# Patient Record
Sex: Female | Born: 1975 | Race: White | Hispanic: No | Marital: Married
Health system: Midwestern US, Academic
[De-identification: ages and names within clinical notes are randomized; demographics above are authoritative.]

---

## 2017-03-28 ENCOUNTER — Ambulatory Visit: Admit: 2017-03-28 | Discharge: 2017-03-28

## 2017-03-28 ENCOUNTER — Encounter: Admit: 2017-03-28 | Discharge: 2017-03-28

## 2017-03-28 ENCOUNTER — Ambulatory Visit: Admit: 2017-03-28 | Discharge: 2017-03-28 | Payer: MEDICAID

## 2017-03-28 DIAGNOSIS — G43101 Migraine with aura, not intractable, with status migrainosus: ICD-10-CM

## 2017-03-28 DIAGNOSIS — F419 Anxiety disorder, unspecified: Principal | ICD-10-CM

## 2017-03-28 DIAGNOSIS — M5412 Radiculopathy, cervical region: ICD-10-CM

## 2017-03-28 DIAGNOSIS — M5030 Other cervical disc degeneration, unspecified cervical region: Principal | ICD-10-CM

## 2017-03-28 DIAGNOSIS — I10 Essential (primary) hypertension: ICD-10-CM

## 2017-03-28 DIAGNOSIS — R110 Nausea: ICD-10-CM

## 2017-03-28 MED ORDER — TOPIRAMATE 25 MG PO TAB
25 mg | ORAL_TABLET | Freq: Two times a day (BID) | ORAL | 3 refills | Status: AC
Start: 2017-03-28 — End: 2017-06-14
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
## 2017-04-05 ENCOUNTER — Ambulatory Visit: Admit: 2017-04-05 | Discharge: 2017-04-05 | Payer: MEDICAID

## 2017-04-05 DIAGNOSIS — M5030 Other cervical disc degeneration, unspecified cervical region: Principal | ICD-10-CM

## 2017-04-05 DIAGNOSIS — M5412 Radiculopathy, cervical region: ICD-10-CM

## 2017-04-05 DIAGNOSIS — G43101 Migraine with aura, not intractable, with status migrainosus: ICD-10-CM

## 2017-04-18 ENCOUNTER — Encounter: Admit: 2017-04-18 | Discharge: 2017-04-18

## 2017-04-18 NOTE — Telephone Encounter
Pt reporting profound s/e to Topamax at 25 mg bid.ENT sx. Asking if she should stop and / or if there are alternative medications. L/M to discuss alternative at F/U, general report on CESI given

## 2017-04-27 ENCOUNTER — Encounter: Admit: 2017-04-27 | Discharge: 2017-04-27

## 2017-04-27 NOTE — Telephone Encounter
Pt has failed chiropractic, Gabapentin,Topamax, Lyrica, CESI, RFA, continues norco bid and takes APAP. Asking for alternative tx ahead of appointment. No earlier appt available. Will forward to Dr Sela Hua for consideratin and ge tback to pt if there is something to offer.

## 2017-05-01 NOTE — Telephone Encounter
Pt defers relaxant. Reports increased pain in neck with relaxants in past.

## 2017-05-04 NOTE — Telephone Encounter
Diclofenac not ordered due to allergies to ASA

## 2017-05-23 ENCOUNTER — Encounter: Admit: 2017-05-23 | Discharge: 2017-05-23

## 2017-05-23 ENCOUNTER — Ambulatory Visit: Admit: 2017-05-23 | Discharge: 2017-05-24 | Payer: MEDICAID

## 2017-05-23 DIAGNOSIS — R110 Nausea: ICD-10-CM

## 2017-05-23 DIAGNOSIS — F419 Anxiety disorder, unspecified: Principal | ICD-10-CM

## 2017-05-23 DIAGNOSIS — I10 Essential (primary) hypertension: ICD-10-CM

## 2017-05-23 MED ORDER — ACETAMINOPHEN-CODEINE 300-30 MG PO TAB
1 | ORAL_TABLET | Freq: Two times a day (BID) | ORAL | 0 refills | 3.00000 days | Status: AC | PRN
Start: 2017-05-23 — End: 2017-06-04

## 2017-05-23 NOTE — Progress Notes
SPINE CENTER CLINIC NOTE  Subjective     SUBJECTIVE: Bilateral posterior cervical pain, hydrocodone takes the edge off of the pain.       Review of Systems   Constitutional: Negative.    HENT: Negative.    Eyes: Negative.    Respiratory: Negative.    Cardiovascular: Negative.    Gastrointestinal: Negative.    Endocrine: Negative.    Genitourinary: Negative.    Musculoskeletal: Negative.    Skin: Negative.    Allergic/Immunologic: Negative.    Neurological: Negative.    Hematological: Negative.    Psychiatric/Behavioral: Negative.    All other systems reviewed and are negative.      Current Outpatient Prescriptions:   ???  albuterol 0.5% (PROVENTIL; VENTOLIN) 2.5 mg/0.5 mL nebulizer solution, Inhale 2.5 mg solution by nebulizer as directed every 6 hours as needed for Shortness of Breath or Wheezing., Disp: , Rfl:   ???  ALPRAZolam (XANAX) 0.5 mg tablet, Take 0.5 mg by mouth at bedtime as needed for Anxiety., Disp: , Rfl:   ???  EPINEPHrine (EPIPEN) 1 mg/mL injection pen (2-Pack), Inject 0.3 mg into the muscle once as needed. Inject 0.3 mg (1 Pen) into thigh if needed for anaphylactic reaction. May repeat in 5-15 minutes if needed., Disp: , Rfl:   ???  fluticasone (FLONASE) 50 mcg/actuation nasal spray, Apply  to each nostril as directed daily. Shake bottle gently before using., Disp: , Rfl:   ???  HYDROcodone/acetaminophen (NORCO) 7.5/325 mg tablet, Take 1 tablet by mouth every 6 hours as needed for Pain, Disp: , Rfl:   ???  ondansetron (ZOFRAN ODT) 4 mg rapid dissolve tablet, Dissolve  by mouth every 8 hours as needed for Nausea or Vomiting. Place on tongue to disolve., Disp: , Rfl:   ???  polyethylene glycol 3350 (MIRALAX) 17 g packet, Take 17 g by mouth daily., Disp: , Rfl:   ???  topiramate (TOPAMAX) 25 mg tablet, Take 1 tablet by mouth every 12 hours., Disp: 60 tablet, Rfl: 3  Allergies   Allergen Reactions   ??? Aspirin ANAPHYLAXIS   ??? Fish Containing Products ANAPHYLAXIS   ??? Ibuprofen ANAPHYLAXIS and EDEMA     Motrin ??? Iodinated Contrast- Oral And Iv Dye ANAPHYLAXIS   ??? Nsaids (Non-Steroidal Anti-Inflammatory Drug) BLISTERS and EDEMA   ??? Penicillins ANAPHYLAXIS   ??? Shellfish Containing Products ANAPHYLAXIS   ??? Tramadol ANAPHYLAXIS, BLISTERS and EDEMA     Physical Exam  Vitals:    05/23/17 1512   BP: (!) 134/94   Pulse: 103   Weight: 81.2 kg (179 lb)   Height: 167.6 cm (66)        Pain Score: Seven  Body mass index is 28.89 kg/m???.  General: Alert and oriented, very pleasant female.   HEENT showed extraocular muscles were intact and no other abnormalities.  Unlabored breathing.  Regular rate and rhythm on CV exam.   5/5 strength in bilateral upper and lower extremities.    Sensation is intact to light touch and equal in the upper and lower extremities.  Bilateral posterior cervical facet tenderness     IMPRESSION:  1. Degeneration of cervical intervertebral disc    2. Migraine with aura and with status migrainosus, not intractable    3. Spondylosis of cervical region without myelopathy or radiculopathy          PLAN:  Will consult Dr. Sunday Corn for neurologic evaluation.  Will schedule C5-7 medial branch blocks.Marland Kitchen

## 2017-05-24 DIAGNOSIS — M47812 Spondylosis without myelopathy or radiculopathy, cervical region: Secondary | ICD-10-CM

## 2017-05-24 DIAGNOSIS — G43101 Migraine with aura, not intractable, with status migrainosus: ICD-10-CM

## 2017-05-24 DIAGNOSIS — M5030 Other cervical disc degeneration, unspecified cervical region: Principal | ICD-10-CM

## 2017-06-04 ENCOUNTER — Encounter: Admit: 2017-06-04 | Discharge: 2017-06-04

## 2017-06-04 MED ORDER — ACETAMINOPHEN-CODEINE 300-30 MG PO TAB
.5 | ORAL_TABLET | ORAL | 0 refills | 3.00000 days | Status: AC | PRN
Start: 2017-06-04 — End: 2017-06-28

## 2017-06-14 ENCOUNTER — Ambulatory Visit: Admit: 2017-06-14 | Discharge: 2017-06-15 | Payer: MEDICAID

## 2017-06-14 ENCOUNTER — Encounter: Admit: 2017-06-14 | Discharge: 2017-06-14

## 2017-06-14 DIAGNOSIS — M47812 Spondylosis without myelopathy or radiculopathy, cervical region: Principal | ICD-10-CM

## 2017-06-14 DIAGNOSIS — F419 Anxiety disorder, unspecified: Principal | ICD-10-CM

## 2017-06-14 DIAGNOSIS — R110 Nausea: ICD-10-CM

## 2017-06-14 DIAGNOSIS — M5412 Radiculopathy, cervical region: Principal | ICD-10-CM

## 2017-06-14 DIAGNOSIS — I10 Essential (primary) hypertension: ICD-10-CM

## 2017-06-14 MED ORDER — BUPIVACAINE (PF) 0.25 % (2.5 MG/ML) IJ SOLN
6 mL | Freq: Once | INTRAMUSCULAR | 0 refills | Status: CP
Start: 2017-06-14 — End: ?
  Administered 2017-06-14: 14:00:00 6 mL via INTRAMUSCULAR

## 2017-06-14 NOTE — Procedures
Attending Surgeon: Clovis Cao, MD    Anesthesia: Local    Pre-Procedure Diagnosis:   1. Spondylosis of cervical region without myelopathy or radiculopathy    2. Degeneration of cervical intervertebral disc    3. Migraine with aura and with status migrainosus, not intractable        Post-Procedure Diagnosis:   1. Spondylosis of cervical region without myelopathy or radiculopathy    2. Degeneration of cervical intervertebral disc    3. Migraine with aura and with status migrainosus, not intractable        MBB/Facet CRV/THRC  Procedure: medial branch block    Laterality: bilateral  Location: cervical - C5-6, C6-7 and C7-T1      Consent:   Consent obtained: written  Consent given by: patient         Universal Protocol:  Relevant documents: relevant documents present and verified  Test results: test results available and properly labeled  Imaging studies: imaging studies available  Required items: required blood products, implants, devices, and special equipment available  Site marked: the operative site was marked  Patient identity confirmed: Patient identify confirmed verbally with patient.      Time out: Immediately prior to procedure a "time out" was called to verify the correct patient, procedure, equipment, support staff and site/side marked as required      Procedures Details:   Prep: chlorhexidine  Estimated Blood Loss: minimal  Specimens: none  Number of Levels: 3  Guidance: fluoroscopy  Needle size: 25 G  Injection procedure: Negative aspiration for blood  Patient tolerance: Patient tolerated the procedure well with no immediate complications. Pressure was applied, and hemostasis was accomplished.  Outcome: Pain improved  Comments: Bupivacaine .25% 1 ml was injected at each location      Estimated blood loss: none or minimal  Specimens: none  Patient tolerated the procedure well with no immediate complications. Pressure was applied, and hemostasis was accomplished.

## 2017-06-14 NOTE — Progress Notes
SPINE CENTER  INTERVENTIONAL PAIN PROCEDURE HISTORY AND PHYSICAL    No chief complaint on file.      HISTORY OF PRESENT ILLNESS:  Bilateral cervical facet related pain, presents for medial branch block  Past Medical History:   Diagnosis Date    Anxiety     Essential hypertension, benign     Nausea        Past Surgical History:   Procedure Laterality Date    HX HYSTERECTOMY         family history is not on file.    Social History     Social History    Marital status: Divorced     Spouse name: N/A    Number of children: N/A    Years of education: N/A     Occupational History    Not on file.     Social History Main Topics    Smoking status: Current Every Day Smoker    Smokeless tobacco: Never Used    Alcohol use No    Drug use: No    Sexual activity: Not on file     Other Topics Concern    Not on file     Social History Narrative    No narrative on file       Allergies   Allergen Reactions    Aspirin ANAPHYLAXIS    Fish Containing Products ANAPHYLAXIS    Ibuprofen ANAPHYLAXIS and EDEMA     Motrin    Iodinated Contrast- Oral And Iv Dye ANAPHYLAXIS    Nsaids (Non-Steroidal Anti-Inflammatory Drug) BLISTERS and EDEMA    Penicillins ANAPHYLAXIS    Shellfish Containing Products ANAPHYLAXIS    Tramadol ANAPHYLAXIS, BLISTERS and EDEMA       There were no vitals filed for this visit.    REVIEW OF SYSTEMS: 10 point ROS obtained and negative except for neck pain      PHYSICAL EXAM:  General: Alert and oriented, very pleasant female.   HEENT showed extraocular muscles were intact and no other abnormalities.  Unlabored breathing.  Regular rate and rhythm on CV exam.   5/5 strength in bilateral upper and lower extremities.    Sensation is intact to light touch and equal in the upper and lower extremities.  Bilateral cervical facet related tenderness        IMPRESSION:    1. Spondylosis of cervical region without myelopathy or radiculopathy         PLAN: bilateral C5-7 medial branch blocks

## 2017-06-15 ENCOUNTER — Ambulatory Visit: Admit: 2017-06-14 | Discharge: 2017-06-15

## 2017-06-15 DIAGNOSIS — Z880 Allergy status to penicillin: ICD-10-CM

## 2017-06-15 DIAGNOSIS — I10 Essential (primary) hypertension: ICD-10-CM

## 2017-06-15 DIAGNOSIS — F17200 Nicotine dependence, unspecified, uncomplicated: ICD-10-CM

## 2017-06-15 DIAGNOSIS — M5030 Other cervical disc degeneration, unspecified cervical region: ICD-10-CM

## 2017-06-15 DIAGNOSIS — G43101 Migraine with aura, not intractable, with status migrainosus: ICD-10-CM

## 2017-06-15 DIAGNOSIS — F419 Anxiety disorder, unspecified: ICD-10-CM

## 2017-06-15 DIAGNOSIS — M5412 Radiculopathy, cervical region: ICD-10-CM

## 2017-06-15 DIAGNOSIS — M47812 Spondylosis without myelopathy or radiculopathy, cervical region: Principal | ICD-10-CM

## 2017-06-22 ENCOUNTER — Encounter: Admit: 2017-06-22 | Discharge: 2017-06-22

## 2017-06-22 DIAGNOSIS — M47812 Spondylosis without myelopathy or radiculopathy, cervical region: Principal | ICD-10-CM

## 2017-06-22 NOTE — Telephone Encounter
report on procedure, 10am sick to stomach, 11am shooting pain down left arm, quit about 1115am. 80% of pain was relieved after that, came back about 2pm, took meds at 3pm, then vomiting. Would like to proceed. Happy with results.     MBB # 1  Per [employer] rte active 05/24/17 no auth required Bil [id number] per Kevan Ny at [facility] [phone] ref # [id number] LE/PC

## 2017-06-28 ENCOUNTER — Encounter: Admit: 2017-06-28 | Discharge: 2017-06-28

## 2017-06-28 MED ORDER — ACETAMINOPHEN-CODEINE 300-30 MG PO TAB
.5 | ORAL_TABLET | ORAL | 0 refills | 3.00000 days | Status: AC | PRN
Start: 2017-06-28 — End: 2017-08-02

## 2017-07-30 ENCOUNTER — Encounter: Admit: 2017-07-30 | Discharge: 2017-07-30

## 2017-08-01 ENCOUNTER — Encounter: Admit: 2017-08-01 | Discharge: 2017-08-01

## 2017-08-02 MED ORDER — ACETAMINOPHEN-CODEINE 300-30 MG PO TAB
.5 | ORAL_TABLET | ORAL | 0 refills | 3.00000 days | Status: AC | PRN
Start: 2017-08-02 — End: 2017-09-24

## 2017-08-23 ENCOUNTER — Ambulatory Visit: Admit: 2017-08-23 | Discharge: 2017-08-24 | Payer: MEDICAID

## 2017-08-23 ENCOUNTER — Ambulatory Visit: Admit: 2017-08-23 | Discharge: 2017-08-24

## 2017-08-23 ENCOUNTER — Encounter: Admit: 2017-08-23 | Discharge: 2017-08-23

## 2017-08-23 DIAGNOSIS — F419 Anxiety disorder, unspecified: Principal | ICD-10-CM

## 2017-08-23 DIAGNOSIS — R110 Nausea: ICD-10-CM

## 2017-08-23 DIAGNOSIS — I10 Essential (primary) hypertension: ICD-10-CM

## 2017-08-23 DIAGNOSIS — M542 Cervicalgia: Principal | ICD-10-CM

## 2017-08-23 MED ORDER — FENTANYL CITRATE (PF) 50 MCG/ML IJ SOLN
50 ug | Freq: Once | INTRAVENOUS | 0 refills | Status: CP
Start: 2017-08-23 — End: ?
  Administered 2017-08-23: 21:00:00 50 ug via INTRAVENOUS

## 2017-08-23 MED ORDER — BUPIVACAINE (PF) 0.25 % (2.5 MG/ML) IJ SOLN
6 mL | Freq: Once | INTRAMUSCULAR | 0 refills | Status: CP
Start: 2017-08-23 — End: ?
  Administered 2017-08-23: 21:00:00 6 mL via INTRAMUSCULAR

## 2017-08-23 MED ORDER — MIDAZOLAM 1 MG/ML IJ SOLN
2 mg | Freq: Once | INTRAVENOUS | 0 refills | Status: CP
Start: 2017-08-23 — End: ?
  Administered 2017-08-23: 21:00:00 2 mg via INTRAVENOUS

## 2017-08-23 NOTE — Progress Notes
RADIO  FREQUENCY  ABLATION  PROCEDURE    Ground Location: Right upper back  ______________________________________  Lead: 1  Location: Right C5  SENSORY STIMULATION  Positive reaction @ n/a volts  MOTOR STIMULATION TEST  Negative response @ 2.0 volts  ABLATION PROCEDURE  Time: 90 sec  Temperature 80 C  Impedance: 128 Ohms   ___________________________________________  Lead: 2  Location:Right  C6  SENSORY STIMULATION  Positive reaction @ n/a volts  MOTOR STIMULATION TEST  Negative response @ 2.0 volts  ABLATION PROCEDURE  Time: 90 sec  Temperature 80 C  Impedance: 123 Ohms   ___________________________________________  Lead: 3  Location:Right C7  SENSORY STIMULATION  Positive reaction @ n/a volts  MOTOR STIMULATION TEST  Negative response @ 2.0 volts  ABLATION PROCEDURE  Time: 90 sec  Temperature 80 C  Impedance: 134 Ohms   ___________________________________________  Lead: 1  Location: Left C5  SENSORY STIMULATION  Positive reaction @ n/a volts  MOTOR STIMULATION TEST  Negative response @ 2.0 volts  ABLATION PROCEDURE  Time: 90 sec  Temperature 80 C  Impedance: 148 Ohms     ___________________________________________  Lead: 2  Location: Left C6  SENSORY STIMULATION  Positive reaction @ n/a volts  MOTOR STIMULATION TEST  Negative response @ 2.0 volts  ABLATION PROCEDURE  Time: 90 sec  Temperature 80 C  Impedance: 149 Ohms     _______________  Lead: 4  Location: Left C 7  SENSORY STIMULATION  Positive reaction @ n/a volts  MOTOR STIMULATION TEST  Negative response @ 2.0 volts  ABLATION PROCEDURE  Time: 90 sec  Temperature 80 C  Impedance: 142 Ohms

## 2017-08-23 NOTE — Progress Notes
SPINE CENTER  INTERVENTIONAL PAIN PROCEDURE HISTORY AND PHYSICAL    Chief Complaint   Patient presents with   ??? Neck - Pain       HISTORY OF PRESENT ILLNESS:  Bonnie James presents for cervical RFA, the medial branch blocks helped more than 80% for 6 hours    Past Medical History:   Diagnosis Date   ??? Anxiety    ??? Essential hypertension, benign    ??? Nausea        Past Surgical History:   Procedure Laterality Date   ??? HX HYSTERECTOMY         family history is not on file.    Social History     Social History   ??? Marital status: Divorced     Spouse name: N/A   ??? Number of children: N/A   ??? Years of education: N/A     Occupational History   ??? Not on file.     Social History Main Topics   ??? Smoking status: Current Every Day Smoker   ??? Smokeless tobacco: Never Used   ??? Alcohol use No   ??? Drug use: No   ??? Sexual activity: Not on file     Other Topics Concern   ??? Not on file     Social History Narrative   ??? No narrative on file       Allergies   Allergen Reactions   ??? Aspirin ANAPHYLAXIS   ??? Disodium Inosinate HIVES and SHORTNESS OF BREATH   ??? Fish Containing Products ANAPHYLAXIS   ??? Ibuprofen ANAPHYLAXIS and EDEMA     Motrin   ??? Iodinated Contrast- Oral And Iv Dye ANAPHYLAXIS   ??? Latex ANAPHYLAXIS   ??? Nsaids (Non-Steroidal Anti-Inflammatory Drug) BLISTERS and EDEMA   ??? Penicillins ANAPHYLAXIS   ??? Shellfish Containing Products ANAPHYLAXIS   ??? Tramadol ANAPHYLAXIS, BLISTERS and EDEMA   ??? Gabapentin AGITATION   ??? Topamax [Topiramate] AGITATION       Vitals:    08/23/17 1448   BP: 124/90   Pulse: 87   Resp: 22   Temp: 36.9 ???C (98.4 ???F)   TempSrc: Oral   SpO2: 97%   Weight: 79.4 kg (175 lb)   Height: 167.6 cm (66)       REVIEW OF SYSTEMS: 10 point ROS obtained and negative except for lumbar pain      PHYSICAL EXAM:  General: Alert and oriented, very pleasant female.   HEENT showed extraocular muscles were intact and no other abnormalities.  Unlabored breathing.  Regular rate and rhythm on CV exam. 5/5 strength in bilateral upper and lower extremities.    Sensation is intact to light touch and equal in the upper and lower extremities.  Bilateral posterior cervical facet tenderness         IMPRESSION:    1. Spondylosis of cervical region without myelopathy or radiculopathy         PLAN: Bilateral C5-7 RFA    General Pre Procedural Sedation ASA Classification      I have discussed risks and alternatives of this type of sedation and procedure with: patient    NPO Status:Acceptable    Pregnancy Status: No    Prior Anesthetic Types: General, Deep sedation, Moderate sedation and Anxiolysis    Patient's had previous experience with anesthesia and/or sedation complications: No    Family history of sedation complications: No    Airway: Airway assessment performed II (soft palate, uvula, fauces visible)    Head  and Neck: No abnormalities noted    Mouth: No abnormalities noted    Medications for Procedural Sedation: Midazolam and Fentanyl    Anesthesia Classification:  ASA II (A normal patient with mild systemic disease)    Patient remains a candidate for procedure: Yes    The intention for the procedure today is Anxiolysis/Analgesia.

## 2017-08-23 NOTE — Procedures
INTERVENTIONAL PAIN MANAGEMENT PROCEDURE REPORT     Radiofrequency Ablation (RFA) of Cervical Facet Medial Branch Nerves    Date of Service: 08/23/2017    Procedure Title(s):    1. Radiofrequency ablation of bilateral C5-7 medial branch nerves   2. Intraoperative fluoroscopy    Attending Surgeon: Gabriel Rung, MD     Pre-Procedure Diagnosis:   1. Spondylosis of cervical region without myelopathy or radiculopathy    2. Cervicogenic headache        Post-Procedure Diagnosis:   1. Spondylosis of cervical region without myelopathy or radiculopathy    2. Cervicogenic headache        Anesthesia: Local                       Anxiolysis Yes           Procedural Sedation No    Pre-Procedure Diagnosis:    1. Spondylosis of cervical region without myelopathy or radiculopathy    2. Cervicogenic headache        Post Procedure Diagnosis:    1. Spondylosis of cervical region without myelopathy or radiculopathy    2. Cervicogenic headache          Indications: Bonnie James is a 41 y.o. female with a diagnosis of cervical spondylosis. The patient's history and physical exam were reviewed. The patient has failed conservative measures including physical therapy and medication management. On exam the patient exhibits significant tenderness in the above stated levels which is exacerbated by extension and lateral flexion to the painful sides. The patient has had Two medial branch blocks with greater than 75% reduction in pain for the duration of the local anesthetic. The risks, benefits and alternatives to the procedure were discussed, and all questions were answered to the patient's satisfaction. The patient agreed to proceed, and written informed consent was obtained.     Procedure in Detail: IV was started? Yes    The patient was brought into the procedure room and placed in the prone position on the fluoroscopy table. Standard monitors were placed, and vital signs were observed throughout the procedure. The area of the cervical spine was prepped with Betadine and draped in a sterile manner.     AP fluoroscopy views were used to identify and mark the mid articular pillars of the C5-7 levels on the right side. The skin and subcutaneous tissues in these areas were anesthetized with 1% lidocaine. A 22-gauge, 3.5 inch, 10 mm active tip radiofrequency probe was directed towards the targeted point under fluoroscopy until bone was contacted. At this point, lateral fluoroscopic views were obtained, and the needle tips were advanced to the centroid of the facets at each level. Negative aspiration was confirmed.  Motor stimulation at 2 Hz and 1.5 volts was negative. Then, 1mL of 1% lidocaine was injected prior to lesioning, which was performed for 90 seconds at 60 degrees centigrade. The probes were then removed. The patient's neck was cleaned, and bandages were placed at the needle insertion sites.  The procedure was repeated at left C5-7 in an identical manner    Disposition: The patient tolerated the procedure well, and there were no apparent complications. Vital signs remained stable througtout the procedure. The patient was taken to the recovery area where discharge instructions for the procedure were given.    Estimated Blood Loss: minimal    Specimens: none    Complications: none

## 2017-08-24 DIAGNOSIS — F419 Anxiety disorder, unspecified: ICD-10-CM

## 2017-08-24 DIAGNOSIS — M47812 Spondylosis without myelopathy or radiculopathy, cervical region: Principal | ICD-10-CM

## 2017-08-24 DIAGNOSIS — M542 Cervicalgia: ICD-10-CM

## 2017-08-24 DIAGNOSIS — Z880 Allergy status to penicillin: ICD-10-CM

## 2017-08-24 DIAGNOSIS — I10 Essential (primary) hypertension: ICD-10-CM

## 2017-08-24 DIAGNOSIS — F17200 Nicotine dependence, unspecified, uncomplicated: ICD-10-CM

## 2017-09-21 ENCOUNTER — Encounter: Admit: 2017-09-21 | Discharge: 2017-09-22

## 2017-09-21 DIAGNOSIS — R69 Illness, unspecified: Principal | ICD-10-CM

## 2017-09-24 ENCOUNTER — Encounter: Admit: 2017-09-24 | Discharge: 2017-09-24

## 2017-09-24 ENCOUNTER — Ambulatory Visit: Admit: 2017-09-24 | Discharge: 2017-09-25 | Payer: MEDICAID

## 2017-09-24 DIAGNOSIS — R69 Illness, unspecified: Principal | ICD-10-CM

## 2017-09-24 DIAGNOSIS — Z91018 Allergy to other foods: ICD-10-CM

## 2017-09-24 DIAGNOSIS — G43719 Chronic migraine without aura, intractable, without status migrainosus: ICD-10-CM

## 2017-09-24 DIAGNOSIS — R110 Nausea: ICD-10-CM

## 2017-09-24 DIAGNOSIS — I10 Essential (primary) hypertension: ICD-10-CM

## 2017-09-24 DIAGNOSIS — F419 Anxiety disorder, unspecified: Principal | ICD-10-CM

## 2017-09-24 MED ORDER — VERAPAMIL 240 MG PO TBER
240 mg | ORAL_TABLET | Freq: Every day | ORAL | 3 refills | 90.00000 days | Status: AC
Start: 2017-09-24 — End: 2018-10-07

## 2017-09-24 MED ORDER — ELETRIPTAN 20 MG PO TAB
ORAL_TABLET | Freq: Once | ORAL | 3 refills | 23.00000 days | Status: AC
Start: 2017-09-24 — End: 2018-10-07
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
## 2017-09-25 DIAGNOSIS — G43101 Migraine with aura, not intractable, with status migrainosus: ICD-10-CM

## 2017-09-25 DIAGNOSIS — M5030 Other cervical disc degeneration, unspecified cervical region: ICD-10-CM

## 2017-09-25 DIAGNOSIS — G43109 Migraine with aura, not intractable, without status migrainosus: Principal | ICD-10-CM

## 2017-12-25 ENCOUNTER — Encounter: Admit: 2017-12-25 | Discharge: 2017-12-25

## 2018-09-26 ENCOUNTER — Encounter: Admit: 2018-09-26 | Discharge: 2018-09-26

## 2018-09-26 ENCOUNTER — Ambulatory Visit: Admit: 2018-09-26 | Discharge: 2018-09-27 | Payer: MEDICAID

## 2018-09-26 DIAGNOSIS — R9431 Abnormal electrocardiogram [ECG] [EKG]: Secondary | ICD-10-CM

## 2018-09-26 DIAGNOSIS — R002 Palpitations: Principal | ICD-10-CM

## 2018-09-27 ENCOUNTER — Encounter: Admit: 2018-09-27 | Discharge: 2018-09-27

## 2018-09-27 DIAGNOSIS — R9431 Abnormal electrocardiogram [ECG] [EKG]: Principal | ICD-10-CM

## 2018-10-02 ENCOUNTER — Encounter: Admit: 2018-10-02 | Discharge: 2018-10-02

## 2018-10-02 DIAGNOSIS — R002 Palpitations: Principal | ICD-10-CM

## 2018-10-02 DIAGNOSIS — R9439 Abnormal result of other cardiovascular function study: ICD-10-CM

## 2018-10-07 ENCOUNTER — Encounter: Admit: 2018-10-07 | Discharge: 2018-10-07

## 2018-10-07 ENCOUNTER — Ambulatory Visit: Admit: 2018-10-07 | Discharge: 2018-10-08 | Payer: MEDICAID

## 2018-10-07 DIAGNOSIS — I10 Essential (primary) hypertension: ICD-10-CM

## 2018-10-07 DIAGNOSIS — Z91018 Allergy to other foods: ICD-10-CM

## 2018-10-07 DIAGNOSIS — R110 Nausea: ICD-10-CM

## 2018-10-07 DIAGNOSIS — R002 Palpitations: ICD-10-CM

## 2018-10-07 DIAGNOSIS — I5189 Other ill-defined heart diseases: Principal | ICD-10-CM

## 2018-10-07 DIAGNOSIS — F419 Anxiety disorder, unspecified: Principal | ICD-10-CM

## 2018-10-07 MED ORDER — METOPROLOL SUCCINATE 25 MG PO TB24
25 mg | ORAL_TABLET | Freq: Every day | ORAL | 3 refills | 90.00000 days | Status: AC
Start: 2018-10-07 — End: 2018-11-06

## 2018-10-07 MED ORDER — LOSARTAN 25 MG PO TAB
25 mg | ORAL_TABLET | Freq: Every day | ORAL | 3 refills | 30.00000 days | Status: AC
Start: 2018-10-07 — End: 2019-06-03

## 2018-11-06 ENCOUNTER — Encounter: Admit: 2018-11-06 | Discharge: 2018-11-06

## 2018-11-06 ENCOUNTER — Ambulatory Visit: Admit: 2018-11-06 | Discharge: 2018-11-07 | Payer: MEDICAID

## 2018-11-06 DIAGNOSIS — F419 Anxiety disorder, unspecified: Secondary | ICD-10-CM

## 2018-11-06 DIAGNOSIS — R002 Palpitations: Secondary | ICD-10-CM

## 2018-11-06 DIAGNOSIS — I10 Essential (primary) hypertension: Secondary | ICD-10-CM

## 2018-11-06 DIAGNOSIS — Z91018 Allergy to other foods: Secondary | ICD-10-CM

## 2018-11-06 DIAGNOSIS — I5189 Other ill-defined heart diseases: Secondary | ICD-10-CM

## 2018-11-06 DIAGNOSIS — R110 Nausea: Secondary | ICD-10-CM

## 2018-11-06 MED ORDER — METOPROLOL SUCCINATE 50 MG PO TB24
50 mg | ORAL_TABLET | Freq: Every day | ORAL | 3 refills | 90.00000 days | Status: AC
Start: 2018-11-06 — End: 2018-12-04

## 2018-11-07 ENCOUNTER — Encounter: Admit: 2018-11-07 | Discharge: 2018-11-07

## 2018-11-07 ENCOUNTER — Ambulatory Visit: Admit: 2018-11-07 | Discharge: 2018-11-08 | Payer: MEDICAID

## 2018-11-07 DIAGNOSIS — R002 Palpitations: Secondary | ICD-10-CM

## 2018-11-12 ENCOUNTER — Encounter: Admit: 2018-11-12 | Discharge: 2018-11-12

## 2018-12-03 ENCOUNTER — Encounter: Admit: 2018-12-03 | Discharge: 2018-12-03

## 2018-12-03 LAB — COMPREHENSIVE METABOLIC PANEL
Lab: 0.3
Lab: 0.9
Lab: 105 — ABNORMAL HIGH (ref 37.0–47.0)
Lab: 13 — ABNORMAL HIGH (ref 27.0–31.0)
Lab: 14
Lab: 140
Lab: 25 — ABNORMAL HIGH (ref 80.0–99.0)
Lab: 3.5
Lab: 4.1
Lab: 7.8
Lab: 9.3
Lab: 91

## 2018-12-03 LAB — CBC: Lab: 12 — ABNORMAL HIGH (ref 4.8–10.8)

## 2018-12-04 ENCOUNTER — Encounter: Admit: 2018-12-04 | Discharge: 2018-12-04

## 2018-12-04 MED ORDER — CARVEDILOL 6.25 MG PO TAB
6.25 mg | ORAL_TABLET | Freq: Two times a day (BID) | ORAL | 3 refills | 90.00000 days | Status: AC
Start: 2018-12-04 — End: 2019-01-15

## 2019-01-14 ENCOUNTER — Encounter: Admit: 2019-01-14 | Discharge: 2019-01-14

## 2019-01-15 ENCOUNTER — Encounter: Admit: 2019-01-15 | Discharge: 2019-01-15

## 2019-01-15 ENCOUNTER — Ambulatory Visit: Admit: 2019-01-15 | Discharge: 2019-01-16 | Payer: MEDICAID

## 2019-01-15 DIAGNOSIS — Z91018 Allergy to other foods: ICD-10-CM

## 2019-01-15 DIAGNOSIS — F419 Anxiety disorder, unspecified: Principal | ICD-10-CM

## 2019-01-15 DIAGNOSIS — I10 Essential (primary) hypertension: ICD-10-CM

## 2019-01-15 DIAGNOSIS — I5189 Other ill-defined heart diseases: Principal | ICD-10-CM

## 2019-01-15 DIAGNOSIS — R002 Palpitations: ICD-10-CM

## 2019-01-15 DIAGNOSIS — R110 Nausea: ICD-10-CM

## 2019-01-15 MED ORDER — CARVEDILOL 12.5 MG PO TAB
12.5 mg | ORAL_TABLET | Freq: Two times a day (BID) | ORAL | 3 refills | 90.00000 days | Status: AC
Start: 2019-01-15 — End: 2019-09-08

## 2019-01-15 NOTE — Progress Notes
Obtained patient's verbal consent to treat them and their agreement to [facility] financial policy and NPP via this telehealth visit during the [facility].    Date of Service: 01/15/2019    Bonnie James is a 43 y.o. female.       HPI     This is a very pleasant 43 year old female whom is seen as part of a synchronous E visit at the department of cardiovascular medicine clinic at the [place], [place] office today for ongoing cardiovascular care. ???She was initially referred after having abnormal LV systolic function on echo Doppler study and regadenoson nuclear perfusion scan ordered by her primary care provider in December 2019. ???She has a past medical history of hypertension, allergic rhinitis, fibromyalgia and intractable migraine headaches.  She does use cigarettes of about 1 pack/day for 15 years or so. ???Her family history is pertinent for premature CAD in her father and paternal grandfather at the ages of 77 and 5 respectively.  Her mother also suffered a TIA at the age of 63.  She has a number of food and medication allergies. ???She reports anaphylaxis to a number of different substances.  ???  In discussion with the patient today, she is concerned that she has a possible COVID-19.  She states that her mom had similar symptoms after returning from the state of [place].  For the past several days the patient has had headache, myalgias, shortness of breath, and fevers.  She reports that she thinks she is starting to feel better today and may be out of the worst of it.    We also reviewed that she went to the emergency department twice in mid February.  At that time she was presenting with palpitations.  She was also concerned that she had exposure to influenza.  I reviewed the emergency department documentation with both visits.  She was afebrile but did have a mild leukocytosis.  Her other serum studies were fairly images also revealed significant diaphragmatic and bowel artifact.  Mildly depressed left ventricular systolic function, ejection fraction 44%, global hypokinesis. In regards to patient's resting and stress EKG, although difficult to determine on the EKG is available for review, the QTc was borderline elevated at rest (491 ms), and a heart rate of 82 bpm, with exercise it was approximately 537 ms at a heart rate of 103 bpm.  Patient should undergo further evaluation of these findings     ??? Spondylosis of cervical region without myelopathy or radiculopathy 09/24/2017         Review of Systems   Constitution: Positive for chills, diaphoresis, fever, malaise/fatigue, night sweats and weight gain.   HENT: Positive for congestion, hearing loss, hoarse voice, nosebleeds, odynophagia, sore throat, stridor and tinnitus.    Eyes: Positive for blurred vision.   Cardiovascular: Positive for chest pain, claudication, dyspnea on exertion, irregular heartbeat, leg swelling, near-syncope, orthopnea, palpitations and paroxysmal nocturnal dyspnea.   Respiratory: Positive for cough, hemoptysis, shortness of breath, sleep disturbances due to breathing, sputum production and wheezing.    Endocrine: Positive for polydipsia.   Hematologic/Lymphatic: Positive for adenopathy. Bruises/bleeds easily.   Skin: Positive for poor wound healing and suspicious lesions.   Musculoskeletal: Positive for arthritis, back pain, joint pain, muscle cramps, muscle weakness, myalgias and neck pain.   Gastrointestinal: Positive for dysphagia.   Genitourinary: Negative.    Neurological: Positive for disturbances in coordination, dizziness, headaches, light-headedness, numbness, paresthesias, sensory change and weakness.   Psychiatric/Behavioral: Positive for depression. The patient  has insomnia.    Allergic/Immunologic: Positive for environmental allergies.       Physical Exam  Physical exam was deferred due to the current global coronavirus pandemic. ??? ondansetron (ZOFRAN ODT) 4 mg rapid dissolve tablet Dissolve  by mouth every 4 hours. Place on tongue to disolve.    ??? rizatriptan (MAXALT) 10 mg tablet TAKE 1 TABLET BY MOUTH EVERY 12 HOURS AS NEEDED FOR MIGRAINE   ??? SAVELLA 25 mg tablet Take 25 mg by mouth twice daily.   ??? sertraline (ZOLOFT) 50 mg tablet Take 1 tablet by mouth daily.

## 2019-01-16 ENCOUNTER — Encounter: Admit: 2019-01-16 | Discharge: 2019-01-16

## 2019-01-30 ENCOUNTER — Encounter: Admit: 2019-01-30 | Discharge: 2019-01-30

## 2019-01-30 ENCOUNTER — Inpatient Hospital Stay: Admit: 2019-01-30 | Discharge: 2019-01-31 | Disposition: A | Discharge status: Home / Self Care | Payer: MEDICAID

## 2019-01-30 DIAGNOSIS — I10 Essential (primary) hypertension: ICD-10-CM

## 2019-01-30 DIAGNOSIS — R110 Nausea: ICD-10-CM

## 2019-01-30 DIAGNOSIS — Z91018 Allergy to other foods: ICD-10-CM

## 2019-01-30 DIAGNOSIS — R6889 Other general symptoms and signs: ICD-10-CM

## 2019-01-30 DIAGNOSIS — F419 Anxiety disorder, unspecified: Principal | ICD-10-CM

## 2019-01-30 MED ORDER — LACTATED RINGERS IV SOLP
1000 mL | INTRAVENOUS | 0 refills | Status: CP
Start: 2019-01-30 — End: ?
  Administered 2019-01-31: 01:00:00 1000 mL via INTRAVENOUS

## 2019-01-31 ENCOUNTER — Encounter: Admit: 2019-01-31 | Discharge: 2019-01-31

## 2019-01-31 ENCOUNTER — Emergency Department: Admit: 2019-01-30 | Discharge: 2019-01-30

## 2019-01-31 DIAGNOSIS — I5022 Chronic systolic (congestive) heart failure: ICD-10-CM

## 2019-01-31 DIAGNOSIS — Z90710 Acquired absence of both cervix and uterus: ICD-10-CM

## 2019-01-31 DIAGNOSIS — F419 Anxiety disorder, unspecified: ICD-10-CM

## 2019-01-31 DIAGNOSIS — R509 Fever, unspecified: Principal | ICD-10-CM

## 2019-01-31 DIAGNOSIS — R0602 Shortness of breath: ICD-10-CM

## 2019-01-31 DIAGNOSIS — R110 Nausea: ICD-10-CM

## 2019-01-31 DIAGNOSIS — Z91018 Allergy to other foods: ICD-10-CM

## 2019-01-31 DIAGNOSIS — R05 Cough: ICD-10-CM

## 2019-01-31 DIAGNOSIS — I428 Other cardiomyopathies: ICD-10-CM

## 2019-01-31 DIAGNOSIS — I110 Hypertensive heart disease with heart failure: ICD-10-CM

## 2019-01-31 DIAGNOSIS — Z79899 Other long term (current) drug therapy: ICD-10-CM

## 2019-01-31 DIAGNOSIS — I10 Essential (primary) hypertension: ICD-10-CM

## 2019-01-31 DIAGNOSIS — F17200 Nicotine dependence, unspecified, uncomplicated: ICD-10-CM

## 2019-01-31 DIAGNOSIS — I471 Supraventricular tachycardia: ICD-10-CM

## 2019-01-31 DIAGNOSIS — I5020 Unspecified systolic (congestive) heart failure: ICD-10-CM

## 2019-01-31 LAB — COMPREHENSIVE METABOLIC PANEL
Lab: 1 mg/dL — ABNORMAL HIGH (ref 0.4–1.00)
Lab: 18 U/L (ref 7–40)
Lab: 4 MMOL/L — ABNORMAL HIGH (ref 3.5–5.1)
Lab: 59 mL/min — ABNORMAL LOW (ref >60)
Lab: 77 U/L — ABNORMAL LOW (ref 25–110)
Lab: 8 10*3/uL — ABNORMAL HIGH (ref 3–12)
Lab: 9 mg/dL (ref 8.5–10.6)
Lab: 93 mg/dL (ref 70–100)
Lab: >60 mL/min — ABNORMAL HIGH (ref >60)
Lab: 108 MMOL/L (ref 98–110)
Lab: 13 U/L (ref 7–40)
Lab: 15 mg/dL (ref 7–25)
Lab: 23 MMOL/L (ref 21–30)
Lab: 63 U/L (ref 25–110)
Lab: >60 mL/min (ref >60)
Lab: >60 mL/min — ABNORMAL HIGH (ref >60)

## 2019-01-31 LAB — PTT (APTT): Lab: 29 s (ref 24.0–36.5)

## 2019-01-31 LAB — PROTIME INR (PT): Lab: 0.9 (ref 0.8–1.2)

## 2019-01-31 LAB — CBC AND DIFF
Lab: 0.1 10*3/uL (ref 0–0.20)
Lab: 0.1 10*3/uL (ref 0–0.45)
Lab: 13 10*3/uL — ABNORMAL HIGH (ref 4.5–11.0)
Lab: 0.1 10*3/uL (ref 0–0.20)
Lab: 0.1 10*3/uL (ref 0–0.45)
Lab: 11 10*3/uL — ABNORMAL HIGH (ref 4.5–11.0)

## 2019-01-31 LAB — BNP POC ER: Lab: <15 pg/mL (ref 0–100)

## 2019-01-31 LAB — D-DIMER: Lab: <21 ng{FEU}/mL (ref <500)

## 2019-01-31 LAB — POC TROPONIN
Lab: 0 ng/mL (ref 0.00–0.05)
Lab: 0 ng/mL (ref 0.00–0.05)

## 2019-01-31 MED ORDER — ACETAMINOPHEN 325 MG PO TAB
650 mg | ORAL | 0 refills | Status: DC | PRN
Start: 2019-01-31 — End: 2019-01-31
  Administered 2019-01-31: 10:00:00 650 mg via ORAL

## 2019-01-31 MED ORDER — ENOXAPARIN 40 MG/0.4 ML SC SYRG
40 mg | Freq: Every day | SUBCUTANEOUS | 0 refills | Status: DC
Start: 2019-01-31 — End: 2019-01-31

## 2019-01-31 MED ORDER — HYDROCODONE-ACETAMINOPHEN 5-325 MG PO TAB
1 | Freq: Once | ORAL | 0 refills | Status: CP
Start: 2019-01-31 — End: ?
  Administered 2019-01-31: 09:00:00 1 via ORAL

## 2019-01-31 MED ORDER — GUAIFENESIN 600 MG PO TA12
600 mg | Freq: Two times a day (BID) | ORAL | 0 refills | Status: DC
Start: 2019-01-31 — End: 2019-01-31
  Administered 2019-01-31: 19:00:00 600 mg via ORAL

## 2019-01-31 MED ORDER — FLUTICASONE PROPIONATE 50 MCG/ACTUATION NA SPSN
1 | Freq: Two times a day (BID) | NASAL | 0 refills | Status: DC
Start: 2019-01-31 — End: 2019-01-31
  Administered 2019-01-31: 15:00:00 1 via NASAL

## 2019-01-31 MED ORDER — MELATONIN 5 MG PO TAB
5 mg | Freq: Every evening | ORAL | 0 refills | Status: DC
Start: 2019-01-31 — End: 2019-01-31
  Administered 2019-01-31: 09:00:00 5 mg via ORAL

## 2019-01-31 MED ORDER — BENZONATATE 100 MG PO CAP
100 mg | Freq: Three times a day (TID) | ORAL | 0 refills | Status: DC | PRN
Start: 2019-01-31 — End: 2019-01-31

## 2019-01-31 MED ORDER — POTASSIUM CHLORIDE 20 MEQ PO TBTQ
40 meq | Freq: Once | ORAL | 0 refills | Status: CP
Start: 2019-01-31 — End: ?
  Administered 2019-01-31: 19:00:00 40 meq via ORAL

## 2019-01-31 MED ORDER — GUAIFENESIN 600 MG PO TA12
600 mg | ORAL_TABLET | Freq: Two times a day (BID) | ORAL | 0 refills | 14.00000 days | Status: DC
Start: 2019-01-31 — End: 2020-01-31

## 2019-01-31 MED ORDER — LOSARTAN 25 MG PO TAB
25 mg | Freq: Every day | ORAL | 0 refills | Status: DC
Start: 2019-01-31 — End: 2019-01-31
  Administered 2019-01-31: 15:00:00 25 mg via ORAL

## 2019-01-31 MED ORDER — SERTRALINE 50 MG PO TAB
50 mg | Freq: Every day | ORAL | 0 refills | Status: DC
Start: 2019-01-31 — End: 2019-01-31
  Administered 2019-01-31: 15:00:00 50 mg via ORAL

## 2019-01-31 MED ORDER — CARVEDILOL 12.5 MG PO TAB
12.5 mg | Freq: Two times a day (BID) | ORAL | 0 refills | Status: DC
Start: 2019-01-31 — End: 2019-01-31
  Administered 2019-01-31 (×2): 12.5 mg via ORAL

## 2019-01-31 MED ORDER — POTASSIUM CHLORIDE 20 MEQ PO TBTQ
20 meq | Freq: Once | ORAL | 0 refills | Status: CP
Start: 2019-01-31 — End: ?
  Administered 2019-01-31: 19:00:00 20 meq via ORAL

## 2019-01-31 MED ORDER — FUROSEMIDE 10 MG/ML IJ SOLN
20 mg | Freq: Once | INTRAVENOUS | 0 refills | Status: CP
Start: 2019-01-31 — End: ?
  Administered 2019-01-31: 19:00:00 20 mg via INTRAVENOUS

## 2019-01-31 MED ORDER — CETIRIZINE 10 MG PO TAB
10 mg | Freq: Every day | ORAL | 0 refills | Status: DC
Start: 2019-01-31 — End: 2019-01-31
  Administered 2019-01-31: 15:00:00 10 mg via ORAL

## 2019-01-31 MED ORDER — BENZONATATE 100 MG PO CAP
100 mg | ORAL_CAPSULE | Freq: Three times a day (TID) | ORAL | 0 refills | 9.00000 days | Status: AC | PRN
Start: 2019-01-31 — End: ?
  Filled 2019-01-31 (×2): qty 20, 7d supply, fill #1

## 2019-01-31 MED ORDER — POTASSIUM CHLORIDE 20 MEQ PO TBTQ
40 meq | Freq: Once | ORAL | 0 refills | Status: DC
Start: 2019-01-31 — End: 2019-01-31

## 2019-01-31 MED ORDER — ALBUTEROL SULFATE 90 MCG/ACTUATION IN HFAA
1 | RESPIRATORY_TRACT | 0 refills | Status: DC | PRN
Start: 2019-01-31 — End: 2019-01-31
  Administered 2019-01-31: 08:00:00 1 via RESPIRATORY_TRACT

## 2019-01-31 NOTE — Progress Notes
Patient arrived to room # Marland [id number]) via wheelchair accompanied by transport. Patient transferred to the bed without assistance. Bedside safety checks completed. Initial patient assessment completed. Refer to flowsheet for details.    Admission skin assessment completed with: Matt, RN    Pressure injury present on arrival?: No    1. Head/Face/Neck: No  2. Trunk/Back: No  3. Upper Extremities: No  4. Lower Extremities: No  5. Pelvic/Coccyx: No  6. Assessed for device associated injury? Yes  7. Malnutrition Screening Tool (Nursing Nutrition Assessment) Completed? Yes    See Doc Flowsheet for additional wound details.     INTERVENTIONS:

## 2019-01-31 NOTE — Progress Notes
[facility] notified of COVID19 negative test result. Informed RN caring for patient, advised to notify attending provider of test result and to determine isolation needs as appropriate. If COVID19 is no longer a concern, the isolation specific to COVID19 should be discontinued and the patient should be transferred to a non-COVID19 cohorted unit as soon as able.      Contact [facility] with any questions [phone]

## 2019-01-31 NOTE — Progress Notes
Patient educated about discharge instructions, follow up appointments, and medications. Patient verbalizes her understanding.

## 2019-03-10 ENCOUNTER — Encounter: Admit: 2019-03-10 | Discharge: 2019-03-10

## 2019-06-02 ENCOUNTER — Encounter: Admit: 2019-06-02 | Discharge: 2019-06-02

## 2019-06-02 DIAGNOSIS — I5189 Other ill-defined heart diseases: Secondary | ICD-10-CM

## 2019-06-03 MED ORDER — LOSARTAN 25 MG PO TAB
ORAL_TABLET | Freq: Every day | ORAL | 1 refills | 90.00000 days | Status: DC
Start: 2019-06-03 — End: 2019-09-08

## 2019-08-13 ENCOUNTER — Encounter: Admit: 2019-08-13 | Discharge: 2019-08-13

## 2019-08-13 DIAGNOSIS — M79672 Pain in left foot: Secondary | ICD-10-CM

## 2019-08-18 ENCOUNTER — Encounter: Admit: 2019-08-18 | Discharge: 2019-08-18

## 2019-08-18 ENCOUNTER — Ambulatory Visit: Admit: 2019-08-18 | Discharge: 2019-08-18 | Payer: MEDICAID

## 2019-08-18 ENCOUNTER — Ambulatory Visit: Admit: 2019-08-18 | Discharge: 2019-08-18

## 2019-08-18 DIAGNOSIS — M79672 Pain in left foot: Secondary | ICD-10-CM

## 2019-08-18 DIAGNOSIS — R110 Nausea: Secondary | ICD-10-CM

## 2019-08-18 DIAGNOSIS — F419 Anxiety disorder, unspecified: Secondary | ICD-10-CM

## 2019-08-18 DIAGNOSIS — S92352A Displaced fracture of fifth metatarsal bone, left foot, initial encounter for closed fracture: Secondary | ICD-10-CM

## 2019-08-18 DIAGNOSIS — Z1159 Encounter for screening for other viral diseases: Secondary | ICD-10-CM

## 2019-08-18 DIAGNOSIS — I10 Essential (primary) hypertension: Secondary | ICD-10-CM

## 2019-08-18 DIAGNOSIS — Z91018 Allergy to other foods: Secondary | ICD-10-CM

## 2019-08-18 MED ORDER — CEFAZOLIN 1 GRAM IJ SOLR
2 g | Freq: Once | INTRAVENOUS | 0 refills | Status: CN
Start: 2019-08-18 — End: ?

## 2019-08-18 NOTE — Patient Instructions
Please have your labs drawn as soon as you are able.     [place], [place] [address]    If you have any questions, you may call [phone].     The lab hours are:   Monday-Friday 7:00 AM to 5:30 PM  Saturday-Sunday 7:00 AM to [place].

## 2019-08-19 ENCOUNTER — Encounter: Admit: 2019-08-19 | Discharge: 2019-08-19

## 2019-08-19 MED ORDER — ERGOCALCIFEROL (VITAMIN D2) 1,250 MCG (50,000 UNIT) PO CAP
1 | ORAL_CAPSULE | ORAL | 0 refills | 56.00000 days | Status: DC
Start: 2019-08-19 — End: 2020-01-30

## 2019-08-26 ENCOUNTER — Encounter: Admit: 2019-08-26 | Discharge: 2019-08-26

## 2019-08-26 DIAGNOSIS — I5189 Other ill-defined heart diseases: Secondary | ICD-10-CM

## 2019-08-26 DIAGNOSIS — R002 Palpitations: Secondary | ICD-10-CM

## 2019-08-26 NOTE — Telephone Encounter
-----   Message from Cornelious Bryant, MD sent at 08/26/2019  3:21 PM CST -----  Regarding: RE: Cardiac Risk Stratification  Just the echo prior to the office visit should be fine.  She doesn't need a thallium in my opinion.    Thanks  Kevan Ny  ----- Message -----  From: Keenan Bachelor, RN  Sent: 08/20/2019   3:52 PM CST  To: Cornelious Bryant, MD  Subject: Cardiac Risk Stratification                      Please see message below. Surgery is scheduled for 09/10/2019 with Dr. Alba Cory. It looks like from last OV note she was due to Echo with doppler study 2 months ago. Besides echo, does she need an updated thallium? We have scheduled her for telehealth 11/16 for office visit. Let me know, thanks. Marylyn Ishihara.       ASSESSMENT AND PLAN:   Left fifth metatarsal fracture with delayed healing.  At this time I recommend doing an ORIF and check a vitamin D level as well as nicotine/cotinine level.  Will have her see her cardiologist first to make sure she is cleared for surgery.  The risks and benefits were described to the patient including, but not limited to bleeding, infection, DVT and continued pain.  She understood these risks and consented for surgery for an ORIF of the left fifth metatarsal with bone grafting from the calcaneus.         BV/abc:kh                                                                                                     Rosina Lowenstein, MD

## 2019-09-01 ENCOUNTER — Ambulatory Visit: Admit: 2019-09-01 | Discharge: 2019-09-01

## 2019-09-01 ENCOUNTER — Encounter: Admit: 2019-09-01 | Discharge: 2019-09-01

## 2019-09-01 DIAGNOSIS — Z8619 Personal history of other infectious and parasitic diseases: Secondary | ICD-10-CM

## 2019-09-01 DIAGNOSIS — I519 Heart disease, unspecified: Secondary | ICD-10-CM

## 2019-09-01 DIAGNOSIS — R002 Palpitations: Secondary | ICD-10-CM

## 2019-09-08 ENCOUNTER — Encounter: Admit: 2019-09-08 | Discharge: 2019-09-08

## 2019-09-08 ENCOUNTER — Ambulatory Visit: Admit: 2019-09-08 | Discharge: 2019-09-09 | Payer: MEDICAID

## 2019-09-08 DIAGNOSIS — M797 Fibromyalgia: Secondary | ICD-10-CM

## 2019-09-08 DIAGNOSIS — M549 Dorsalgia, unspecified: Secondary | ICD-10-CM

## 2019-09-08 DIAGNOSIS — F845 Asperger's syndrome: Secondary | ICD-10-CM

## 2019-09-08 DIAGNOSIS — Z91018 Allergy to other foods: Secondary | ICD-10-CM

## 2019-09-08 DIAGNOSIS — I10 Essential (primary) hypertension: Secondary | ICD-10-CM

## 2019-09-08 DIAGNOSIS — T63301A Toxic effect of unspecified spider venom, accidental (unintentional), initial encounter: Secondary | ICD-10-CM

## 2019-09-08 DIAGNOSIS — F419 Anxiety disorder, unspecified: Secondary | ICD-10-CM

## 2019-09-08 DIAGNOSIS — I519 Heart disease, unspecified: Secondary | ICD-10-CM

## 2019-09-08 DIAGNOSIS — Z01810 Encounter for preprocedural cardiovascular examination: Secondary | ICD-10-CM

## 2019-09-08 DIAGNOSIS — G43909 Migraine, unspecified, not intractable, without status migrainosus: Secondary | ICD-10-CM

## 2019-09-08 DIAGNOSIS — R002 Palpitations: Secondary | ICD-10-CM

## 2019-09-08 DIAGNOSIS — R110 Nausea: Secondary | ICD-10-CM

## 2019-09-08 DIAGNOSIS — I5189 Other ill-defined heart diseases: Secondary | ICD-10-CM

## 2019-09-08 MED ORDER — CARVEDILOL 12.5 MG PO TAB
6.25 mg | ORAL_TABLET | Freq: Two times a day (BID) | ORAL | 3 refills | 90.00000 days | Status: DC
Start: 2019-09-08 — End: 2020-02-02

## 2019-09-08 MED ORDER — LOSARTAN 50 MG PO TAB
50 mg | ORAL_TABLET | Freq: Every day | ORAL | 3 refills | 90.00000 days | Status: AC
Start: 2019-09-08 — End: ?

## 2019-09-10 ENCOUNTER — Encounter: Admit: 2019-09-10 | Discharge: 2019-09-10

## 2019-09-10 DIAGNOSIS — F419 Anxiety disorder, unspecified: Secondary | ICD-10-CM

## 2019-09-10 DIAGNOSIS — F845 Asperger's syndrome: Secondary | ICD-10-CM

## 2019-09-10 DIAGNOSIS — Z91018 Allergy to other foods: Secondary | ICD-10-CM

## 2019-09-10 DIAGNOSIS — I10 Essential (primary) hypertension: Secondary | ICD-10-CM

## 2019-09-10 DIAGNOSIS — I519 Heart disease, unspecified: Secondary | ICD-10-CM

## 2019-09-10 DIAGNOSIS — M549 Dorsalgia, unspecified: Secondary | ICD-10-CM

## 2019-09-10 DIAGNOSIS — M797 Fibromyalgia: Secondary | ICD-10-CM

## 2019-09-10 DIAGNOSIS — R110 Nausea: Secondary | ICD-10-CM

## 2019-09-10 DIAGNOSIS — G43909 Migraine, unspecified, not intractable, without status migrainosus: Secondary | ICD-10-CM

## 2019-09-10 DIAGNOSIS — T63301A Toxic effect of unspecified spider venom, accidental (unintentional), initial encounter: Secondary | ICD-10-CM

## 2019-09-10 MED ORDER — OXYCODONE 5 MG PO TAB
5 mg | ORAL_TABLET | ORAL | 0 refills | 6.00000 days | Status: DC | PRN
Start: 2019-09-10 — End: 2019-09-20

## 2019-09-10 MED ORDER — ONDANSETRON HCL 4 MG PO TAB
4 mg | ORAL_TABLET | ORAL | 0 refills | 8.00000 days | Status: DC | PRN
Start: 2019-09-10 — End: 2019-10-28

## 2019-09-10 MED ORDER — SODIUM CHLORIDE 0.9 % IR SOLN
0 refills | Status: DC
Start: 2019-09-10 — End: 2019-09-15

## 2019-09-10 MED ORDER — LACTATED RINGERS IV SOLP
1000 mL | INTRAVENOUS | 0 refills | Status: DC
Start: 2019-09-10 — End: 2019-09-15

## 2019-09-10 MED ORDER — ROPIVACAINE (PF) 2 MG/ML (0.2 %) IJ SOLN
0 refills | Status: DC
Start: 2019-09-10 — End: 2019-09-15

## 2019-09-10 MED ORDER — CEFAZOLIN 1 GRAM IJ SOLR
2 g | Freq: Once | INTRAVENOUS | 0 refills | Status: DC
Start: 2019-09-10 — End: 2019-09-15

## 2019-09-10 MED ORDER — ONDANSETRON HCL (PF) 4 MG/2 ML IJ SOLN
INTRAVENOUS | 0 refills | Status: DC
Start: 2019-09-10 — End: 2019-09-10

## 2019-09-10 MED ORDER — PROPOFOL INJ 10 MG/ML IV VIAL
0 refills | Status: DC
Start: 2019-09-10 — End: 2019-09-10

## 2019-09-10 MED ORDER — MIDAZOLAM 1 MG/ML IJ SOLN
INTRAVENOUS | 0 refills | Status: DC
Start: 2019-09-10 — End: 2019-09-10

## 2019-09-10 MED ORDER — PROMETHAZINE 25 MG/ML IJ SOLN
6.25 mg | INTRAVENOUS | 0 refills | Status: DC | PRN
Start: 2019-09-10 — End: 2019-09-15

## 2019-09-10 MED ORDER — FENTANYL CITRATE (PF) 50 MCG/ML IJ SOLN
25 ug | INTRAVENOUS | 0 refills | Status: DC | PRN
Start: 2019-09-10 — End: 2019-09-15

## 2019-09-10 MED ORDER — DOCUSATE SODIUM 100 MG PO CAP
100 mg | ORAL_CAPSULE | Freq: Two times a day (BID) | ORAL | 0 refills | Status: DC
Start: 2019-09-10 — End: 2019-09-20

## 2019-09-10 MED ORDER — ONDANSETRON HCL (PF) 4 MG/2 ML IJ SOLN
4 mg | Freq: Once | INTRAVENOUS | 0 refills | Status: AC | PRN
Start: 2019-09-10 — End: ?

## 2019-09-10 MED ORDER — DEXTRAN 70-HYPROMELLOSE (PF) 0.1-0.3 % OP DPET
0 refills | Status: DC
Start: 2019-09-10 — End: 2019-09-10

## 2019-09-10 MED ORDER — LIDOCAINE (PF) 200 MG/10 ML (2 %) IJ SYRG
0 refills | Status: DC
Start: 2019-09-10 — End: 2019-09-10

## 2019-09-10 MED ORDER — LIDOCAINE (PF) 10 MG/ML (1 %) IJ SOLN
SUBCUTANEOUS | 0 refills | Status: DC
Start: 2019-09-10 — End: 2019-09-10

## 2019-09-10 MED ORDER — DEXAMETHASONE SODIUM PHOSPHATE 4 MG/ML IJ SOLN
INTRAVENOUS | 0 refills | Status: DC
Start: 2019-09-10 — End: 2019-09-10

## 2019-09-10 MED ORDER — PHENYLEPHRINE IN 0.9% NACL(PF) 1 MG/10 ML (100 MCG/ML) IV SYRG
INTRAVENOUS | 0 refills | Status: DC
Start: 2019-09-10 — End: 2019-09-10

## 2019-09-10 MED ORDER — FENTANYL CITRATE (PF) 50 MCG/ML IJ SOLN
0 refills | Status: DC
Start: 2019-09-10 — End: 2019-09-10

## 2019-09-10 MED ORDER — ROPIVACAINE (PF) 5 MG/ML (0.5 %) IJ SOLN
0 refills | Status: DC
Start: 2019-09-10 — End: 2019-09-10

## 2019-09-10 MED ORDER — HYDROMORPHONE (PF) 2 MG/ML IJ SYRG
0 refills | Status: DC
Start: 2019-09-10 — End: 2019-09-10

## 2019-09-10 MED ORDER — METOCLOPRAMIDE HCL 5 MG/ML IJ SOLN
10 mg | Freq: Once | INTRAVENOUS | 0 refills | Status: AC | PRN
Start: 2019-09-10 — End: ?

## 2019-09-10 MED ORDER — FENTANYL CITRATE (PF) 50 MCG/ML IJ SOLN
50 ug | INTRAVENOUS | 0 refills | Status: DC | PRN
Start: 2019-09-10 — End: 2019-09-15

## 2019-09-10 MED ORDER — CLINDAMYCIN ICC (NS) IVPB
0 refills | Status: DC
Start: 2019-09-10 — End: 2019-09-10

## 2019-09-10 MED ORDER — RIVAROXABAN 10 MG PO TAB
10 mg | ORAL_TABLET | Freq: Every day | ORAL | 0 refills | 30.00000 days | Status: DC
Start: 2019-09-10 — End: 2019-09-26

## 2019-09-10 MED ORDER — OXYCODONE 5 MG PO TAB
5-10 mg | Freq: Once | ORAL | 0 refills | Status: CP | PRN
Start: 2019-09-10 — End: ?

## 2019-09-10 NOTE — Anesthesia Procedure Notes
Anesthesia Procedure: Peripheral Nerve Block    PERIPHERAL NERVE BLOCK    Date/Time: 09/10/2019 3:18 PM    Patient location: post-op  Reason for block: at surgeon's request and post-op pain management    Preprocedure checklist performed: 2 patient identifiers, risks & benefits discussed, patient evaluated, timeout performed, consent obtained, patient being monitored and sterile drape    Sterile technique:  - Proper hand washing  - Cap, mask  - Sterile gloves  - Skin prep for antisepsis        Peripheral Nerve Block Procedure   Patient position: supine  Prep: ChloraPrep    Monitoring: BP, EKG and continuous pulse ox  Block type: popliteal  Laterality: left  Injection technique: single-shot  Procedures: ultrasound guided      Needle/cathether:      Needle type: Stimuplex      Needle gauge: 22 G; Needle length: 4 in     Needle location: anatomical landmarks and ultrasound guidance    Procedure Medications    Local Anesthesia: lidocaine PF 1% (10 mg/mL) injection, 2 mL  Bolus Dose: ropivacaine (PF) 0.5% (NAROPIN) injection, 20 mL    Procedure Outcome   Injection assessment: negative aspiration for heme, no paresthesia on injection, incremental injection and local visualized surrounding nerve on ultrasound  Observations: adequate block, patient sedated but conversant throughout block, patient tolerated the procedure well with no immediate complications and comfortable throughout block      Refer to nursing documentation for vitals and monitoring data during procedure.    Performed by: Driscilla Moats, MD  Authorized by: Driscilla Moats, MD

## 2019-09-10 NOTE — Anesthesia Post-Procedure Evaluation
Post-Anesthesia Evaluation    Name: Bonnie James      MRN: [id number]     DOB: 1976-10-10     Age: 43 y.o.     Sex: female   __________________________________________________________________________     Procedure Information     Anesthesia Start Date/Time: 09/10/19 1322    Procedure: OPEN TREATMENT METATARSAL FRACTURE WITH INTERNAL FIXATION - FIFTH METATARSAL; non-union (Left Foot)    Location: [facility]    Surgeon: Ezekiel Ina, MD          Post-Anesthesia Vitals  BP: 113/88 (11/18 1555)  Temp: 36.5 C (97.7 F) (11/18 1442)  Pulse: 110 (11/18 1555)  Respirations: 19 PER MINUTE (11/18 1555)  SpO2: 94 % (11/18 1555)  SpO2 Pulse: 110 (11/18 1555)  Height: 167.6 cm (66") (11/18 1105)   Vitals Value Taken Time   BP 113/88 09/10/19 1555   Temp 36.5 C (97.7 F) 09/10/19 1442   Pulse 110 09/10/19 1555   Respirations 19 PER MINUTE 09/10/19 1555   SpO2 94 % 09/10/19 1555         Post Anesthesia Evaluation Note    Evaluation location: pre/post  Patient participation: recovered; patient participated in evaluation  Level of consciousness: alert    Pain score: 4  Pain management: adequate    Hydration: normovolemia  Temperature: 36.0C - 38.4C  Airway patency: adequate    Perioperative Events       Post-op nausea and vomiting: no PONV    Postoperative Status  Cardiovascular status: hemodynamically stable  Respiratory status: spontaneous ventilation  Follow-up needed: none        Perioperative Events  Perioperative Event: No  Emergency Case Activation: No

## 2019-09-10 NOTE — Anesthesia Pre-Procedure Evaluation
Anesthesia Pre-Procedure Evaluation    Name: Bonnie James      MRN: [id number]     DOB: October 08, 1976     Age: 43 y.o.     Sex: female   _________________________________________________________________________     Procedure Info:   Procedure Information     Date/Time: 09/10/19 1235    Procedure: OPEN TREATMENT METATARSAL FRACTURE WITH/ WITHOUT INTERNAL FIXATION - FIFTH METATARSAL (Left )    Location: [facility] / [facility]    Surgeon: Tanja Port, MD          Physical Assessment  Vital Signs (last filed in past 24 hours):  BP: 143/101 (11/18 1105)  Temp: 36.5 ?C (97.7 ?F) (11/18 1105)  Pulse: 102 (11/18 1105)  Respirations: 20 PER MINUTE (11/18 1105)  SpO2: 96 % (11/18 1105)  Height: 167.6 cm (66) (11/18 1105)  Weight: 102.9 kg (226 lb 12.8 oz) (11/18 1105)      Patient History   Allergies   Allergen Reactions   ? Aspirin ANAPHYLAXIS   ? Disodium Inosinate HIVES and SHORTNESS OF BREATH   ? Fish Containing Products ANAPHYLAXIS   ? Ibuprofen ANAPHYLAXIS and EDEMA     Motrin   ? Iodinated Contrast Media ANAPHYLAXIS   ? Latex ANAPHYLAXIS   ? Nsaids (Non-Steroidal Anti-Inflammatory Drug) BLISTERS and EDEMA   ? Penicillins ANAPHYLAXIS   ? Shellfish Containing Products ANAPHYLAXIS   ? Gabapentin AGITATION   ? Topamax [Topiramate] AGITATION        Current Medications    Medication Directions   albuterol 0.5% (PROVENTIL; VENTOLIN) 2.5 mg/0.5 mL nebulizer solution Inhale 2.5 mg solution by nebulizer as directed every 6 hours as needed for Shortness of Breath or Wheezing.   albuterol sulfate (PROAIR HFA) 90 mcg/actuation aerosol inhaler Inhale 1 puff by mouth into the lungs every 2 hours. Shake well before use.   baclofen (LIORESAL) 20 mg tablet Take 20 mg by mouth as Needed.   benzonatate (TESSALON PERLES) 100 mg capsule Take one capsule by mouth three times daily as needed for Cough.   CALCIUM PO Take  by mouth.   carvediloL (COREG) 12.5 mg tablet Take one-half tablet by mouth twice daily. Take with food. cetirizine (ZYRTEC) 10 mg tablet Take 10 mg by mouth daily.   CLINDAMYCIN HCL PO Take  by mouth.   diphenhydrAMINE (BENADRYL) 25 mg capsule Take 25 mg by mouth every 6 hours.   docusate (COLACE) 100 mg capsule Take one capsule by mouth twice daily.   EPINEPHrine (EPIPEN) 1 mg/mL injection pen (2-Pack) Inject 0.3 mg into the muscle once as needed. Inject 0.3 mg (1 Pen) into thigh if needed for anaphylactic reaction. May repeat in 5-15 minutes if needed.   ergocalciferol (VITAMIN D-2) 1,250 mcg (50,000 unit) capsule Take one capsule by mouth every 7 days.   famotidine (PEPCID) 20 mg tablet Take 20 mg by mouth as Needed.   fluticasone (FLONASE) 50 mcg/actuation nasal spray Apply  to each nostril as directed daily. Shake bottle gently before using.   Folic Acid-Vit B6-Vit B12 2.5-25-1 mg tab Take 1 tablet by mouth daily.   guaiFENesin LA (MUCINEX) 600 mg tablet Take one tablet by mouth twice daily.   hydrOXYzine pamoate (VISTARIL) 25 mg capsule Every 4-6 Hours As Needed as needed for For Allergies   lidocaine (LIDODERM) 5 % topical patch Apply 1 patch topically to affected area as Needed for Pain. Apply patch for 12 hours, then remove for 12 hours before repeating.  losartan (COZAAR) 50 mg tablet Take one tablet by mouth daily.   magnesium oxide (MAG-OX) 400 mg (241.3 mg magnesium) tablet Take 400 mg by mouth daily.   ondansetron (ZOFRAN) 4 mg tablet Take one tablet by mouth every 8 hours as needed for Nausea or Vomiting. Indications: prevent nausea and vomiting after surgery   oxyCODONE (ROXICODONE) 5 mg tablet Take one tablet by mouth every 4 hours as needed for Pain   rivaroxaban (XARELTO) 10 mg tablet Take one tablet by mouth daily.   rizatriptan (MAXALT) 10 mg tablet TAKE 1 TABLET BY MOUTH EVERY 12 HOURS AS NEEDED FOR MIGRAINE   SAVELLA 25 mg tablet Take 25 mg by mouth twice daily.   sertraline (ZOLOFT) 50 mg tablet Take 1 tablet by mouth daily.         Review of Systems/Medical History Patient summary reviewed  Pertinent labs reviewed    PONV Screening: Female gender and Postoperative opioids  History of anesthetic complications (patient reports recall with previous foot surgery and combative postoperative)  No family history of anesthetic complications      Airway - negative        Pulmonary       Current smoker; patient smoked on day of surgery        Asthma    no COPD      No sleep apnea      Cardiovascular       Recent diagnostic studies:          echocardiogram          ? Left Ventricle: The left ventricular size is normal. Concentric remodeling. Poor LV endocardial border visualization, systolic function appears to be mildly to moderately reduced, visually EF 40-45%.Mild global hypokinesis slightly more prominent in the septum. No major changes compared to prior study dated 09/26/2018. Grade I (mild) left ventricular diastolic dysfunction.  ? Right Ventricle: The right ventricular size is normal. The right ventricular systolic function is normal.  ? No hemodynamically significant valvular abnormalities.  ? Normal central venous pressure (0-5 mm Hg).       Beta Blocker therapy: Yes      Beta blockers within 24 hours: Yes      No pacemaker      No AICD      Hypertension,       No valvular problems/murmurs      No past MI,       No hx of coronary artery disease      No coronary artery bypass graft      No PTCA      No palpitations      No dysrhythmias      No angina      GI/Hepatic/Renal         No GERD,       No hx of liver disease     No renal disease      Neuro/Psych       No seizures      No hx TIA      Headaches      Psychiatric history (Asperger's)      Musculoskeletal         Back pain      Arthritis      Fractures      Endocrine/Other       No diabetes      No hypothyroidism      No hyperthyroidism    Constitution - negative   Physical Exam  Airway Findings      Mallampati: II      TM distance: >3 FB      Neck ROM: full      Mouth opening: good    Dental Findings: Cardiovascular Findings:       Rhythm: regular      Rate: normal    Pulmonary Findings:       Breath sounds clear to auscultation.       Diagnostic Tests  Hematology:   Lab Results   Component Value Date    HGB 13.3 01/31/2019    HCT 38.9 01/31/2019    PLTCT 210 01/31/2019    WBC 11.5 01/31/2019    NEUT 49 01/31/2019    ANC 5.60 01/31/2019    ALC 4.90 01/31/2019    MONA 7 01/31/2019    AMC 0.80 01/31/2019    EOSA 1 01/31/2019    ABC 0.10 01/31/2019    MCV 96.7 01/31/2019    MCH 33.0 01/31/2019    MCHC 34.1 01/31/2019    MPV 8.9 01/31/2019    RDW 13.6 01/31/2019         General Chemistry:   Lab Results   Component Value Date    NA 139 01/31/2019    K 3.3 01/31/2019    CL 108 01/31/2019    CO2 23 01/31/2019    GAP 8 01/31/2019    BUN 15 01/31/2019    CR 0.79 01/31/2019    GLU 113 01/31/2019    CA 8.3 01/31/2019    ALBUMIN 3.4 01/31/2019    TOTBILI 0.4 01/31/2019      Coagulation:   Lab Results   Component Value Date    PTT 29.0 01/30/2019    INR 0.9 01/30/2019         Anesthesia Plan    ASA score: 3   Plan: general and regional for postoperative pain  Induction method: intravenous  NPO status: acceptable      Informed Consent  Anesthetic plan and risks discussed with patient.        Plan discussed with: CRNA and surgeon/proceduralist.  Comments: (Risks of GA with LMA/OETT including sore throat, oral/dental injury, allergic reactions, PONV, aspiration, respiratory failure, MI, CVA discussed with patient who reports understanding and consents to anesthetic plan. )

## 2019-09-11 ENCOUNTER — Encounter: Admit: 2019-09-11 | Discharge: 2019-09-11

## 2019-09-11 DIAGNOSIS — M549 Dorsalgia, unspecified: Secondary | ICD-10-CM

## 2019-09-11 DIAGNOSIS — I10 Essential (primary) hypertension: Secondary | ICD-10-CM

## 2019-09-11 DIAGNOSIS — G43909 Migraine, unspecified, not intractable, without status migrainosus: Secondary | ICD-10-CM

## 2019-09-11 DIAGNOSIS — F845 Asperger's syndrome: Secondary | ICD-10-CM

## 2019-09-11 DIAGNOSIS — F419 Anxiety disorder, unspecified: Secondary | ICD-10-CM

## 2019-09-11 DIAGNOSIS — Z91018 Allergy to other foods: Secondary | ICD-10-CM

## 2019-09-11 DIAGNOSIS — T63301A Toxic effect of unspecified spider venom, accidental (unintentional), initial encounter: Secondary | ICD-10-CM

## 2019-09-11 DIAGNOSIS — R110 Nausea: Secondary | ICD-10-CM

## 2019-09-11 DIAGNOSIS — M797 Fibromyalgia: Secondary | ICD-10-CM

## 2019-09-11 DIAGNOSIS — I519 Heart disease, unspecified: Secondary | ICD-10-CM

## 2019-09-12 ENCOUNTER — Encounter: Admit: 2019-09-12 | Discharge: 2019-09-12

## 2019-09-12 NOTE — Telephone Encounter
Patient called yesterday 11/19 reporting high levels of post operative pain.  Educated patient on oxycodone dosage and frequency along with encouraged extra strength Tylenol.  Encouraged elevation.  Patient called today to report that she had experienced an allergic reaction to the post operative Xarelto and went to the ER in [place], [place] last night.  She was given a solumedrol pack and Benadryl.  Penelope Coop PA-C notified. DC Xarelto, no additional anticoagulation ordered at this time as patient is also allergic to ASA.  Educated patient on s/s of potential DVT ie: swelling, warmth, redness, calf pain.  Patient reports understanding and appreciative of return call.

## 2019-09-20 ENCOUNTER — Emergency Department: Admit: 2019-09-20 | Discharge: 2019-09-20

## 2019-09-20 ENCOUNTER — Encounter: Admit: 2019-09-20 | Discharge: 2019-09-20

## 2019-09-20 ENCOUNTER — Emergency Department: Admit: 2019-09-20 | Discharge: 2019-09-21 | Payer: MEDICAID

## 2019-09-20 DIAGNOSIS — G8918 Other acute postprocedural pain: Secondary | ICD-10-CM

## 2019-09-20 MED ORDER — OXYCODONE 5 MG PO TAB
5 mg | ORAL | 0 refills | Status: DC | PRN
Start: 2019-09-20 — End: 2019-09-21
  Administered 2019-09-20: 5 mg via ORAL

## 2019-09-20 MED ORDER — OXYCODONE 5 MG PO TAB
5 mg | ORAL_TABLET | ORAL | 0 refills | 6.00000 days | Status: DC | PRN
Start: 2019-09-20 — End: 2019-10-28

## 2019-09-20 MED ORDER — DOCUSATE SODIUM 100 MG PO CAP
200 mg | ORAL_CAPSULE | Freq: Two times a day (BID) | ORAL | 0 refills | Status: DC
Start: 2019-09-20 — End: 2019-10-28

## 2019-09-20 MED ORDER — SENNOSIDES 8.6 MG PO TAB
2 | ORAL_TABLET | Freq: Two times a day (BID) | ORAL | 0 refills | Status: DC
Start: 2019-09-20 — End: 2020-01-30

## 2019-09-20 NOTE — Consults
[facility] Consult Note      Admission Date: 09/20/2019                                                  Chief Complaint/Reason for Consult:  L leg pain, itching, tingling    Assessment/Plan     Bonnie James is a 43 y.o. female who is s/p ORIF L 5th metatarsal on 11/18 who presents to the ED with increasing discomfort in her operative extremity     -Surgical intervention: No acute surgical intervention at this time   -Will obtain an ultrasound to rule out DVT secondary to complaint of calf discomfort   -ultrasound negative for DVT  -WB Status - continue NWB LLE  -Pain control/medical management per ED  -Recommend patient keep follow up appointment with Dr. Essie Christine as scheduled.   -Patient placed back into a well-padded splint and is agreeable to maintain splint until follow-up with Dr. Essie Christine.  Romie Minus call [phone] with any questions or concerns      Patient discussed with staff surgeon Dr. Essie Christine who directed plan of care.      Lance Coon, MD  [phone]  ______________________________________________________________________ History of Present Illness: Bonnie James is a 43 y.o. female with a past medical history significant for Asperger's disorder, anxiety, hypertension, fibromyalgia and chronic nausea who presents to the ED complaining of left ankle and leg pain. She notes that the discomfort in her leg started about 5 days ago and has been progressively worsening. She feels a burning pain in her calf that spreads to her foot and ankle as a throbbing pain and then becomes numbness and tingling in her toes. She notes that her Asperger's gives her sensory issues, including hypersensitivity and thinks that a lot of what she is experiencing is due to the restriction of the splint and her sensitivity to having things touching her skin. She thinks she was tolerating the splint better while taking pain medication however ran out a few days ago.  She denies any recent trauma to her left leg and has been compliant with being nonweightbearing.    Medical History:   Diagnosis Date   ? Anxiety    ? Asperger's disorder    ? Back pain    ? Essential hypertension, benign    ? Fibromyalgia    ? Migraines    ? Multiple food allergies    ? Nausea    ? Spider bite    ? Systolic dysfunction      Surgical History:   Procedure Laterality Date   ? OPEN TREATMENT METATARSAL FRACTURE WITH INTERNAL FIXATION - FIFTH METATARSAL; non-union Left 09/10/2019    Performed by Vopat, Lowry Ram, MD at [facility] OR   ? HX HYSTERECTOMY       Social History     Tobacco Use   ? Smoking status: Current Every Day Smoker     Packs/day: 1.50   ? Smokeless tobacco: Never Used   Substance Use Topics   ? Alcohol use: Yes     Alcohol/week: 2.0 standard drinks     Types: 2 Shots of liquor per week     Comment: a few times/week, using to help pain control   ? Drug use: Not Currently     Comment: CBD oil       Tobacco: smokes 1ppd  Alcohol: endorses use- less recently  Drugs: denies use    Family Hx: Reviewed and non-contributory Allergies:  Aspirin, Disodium inosinate, Fish containing products, Ibuprofen, Iodinated contrast media, Latex, Nsaids (non-steroidal anti-inflammatory drug), Penicillins, Shellfish containing products, Gabapentin, and Topamax [topiramate]    Outpatient Medications as of 09/20/2019   Medication Sig Dispense Refill   ? albuterol 0.5% (PROVENTIL; VENTOLIN) 2.5 mg/0.5 mL nebulizer solution Inhale 2.5 mg solution by nebulizer as directed every 6 hours as needed for Shortness of Breath or Wheezing.     ? albuterol sulfate (PROAIR HFA) 90 mcg/actuation aerosol inhaler Inhale 1 puff by mouth into the lungs every 2 hours. Shake well before use.     ? baclofen (LIORESAL) 20 mg tablet Take 20 mg by mouth as Needed.     ? benzonatate (TESSALON PERLES) 100 mg capsule Take one capsule by mouth three times daily as needed for Cough. 20 capsule 0   ? CALCIUM PO Take  by mouth.     ? carvediloL (COREG) 12.5 mg tablet Take one-half tablet by mouth twice daily. Take with food. 90 tablet 3   ? cetirizine (ZYRTEC) 10 mg tablet Take 10 mg by mouth daily.     ? CLINDAMYCIN HCL PO Take  by mouth.     ? diphenhydrAMINE (BENADRYL) 25 mg capsule Take 25 mg by mouth every 6 hours.     ? EPINEPHrine (EPIPEN) 1 mg/mL injection pen (2-Pack) Inject 0.3 mg into the muscle once as needed. Inject 0.3 mg (1 Pen) into thigh if needed for anaphylactic reaction. May repeat in 5-15 minutes if needed.     ? ergocalciferol (VITAMIN D-2) 1,250 mcg (50,000 unit) capsule Take one capsule by mouth every 7 days. 6 capsule 0   ? famotidine (PEPCID) 20 mg tablet Take 20 mg by mouth as Needed.     ? fluticasone (FLONASE) 50 mcg/actuation nasal spray Apply  to each nostril as directed daily. Shake bottle gently before using.     ? Folic Acid-Vit B6-Vit B12 2.5-25-1 mg tab Take 1 tablet by mouth daily.     ? guaiFENesin LA (MUCINEX) 600 mg tablet Take one tablet by mouth twice daily. 30 tablet 0 ? hydrOXYzine pamoate (VISTARIL) 25 mg capsule Every 4-6 Hours As Needed as needed for For Allergies     ? lidocaine (LIDODERM) 5 % topical patch Apply 1 patch topically to affected area as Needed for Pain. Apply patch for 12 hours, then remove for 12 hours before repeating.     ? losartan (COZAAR) 50 mg tablet Take one tablet by mouth daily. 90 tablet 3   ? magnesium oxide (MAG-OX) 400 mg (241.3 mg magnesium) tablet Take 400 mg by mouth daily.     ? ondansetron (ZOFRAN) 4 mg tablet Take one tablet by mouth every 8 hours as needed for Nausea or Vomiting. Indications: prevent nausea and vomiting after surgery 30 tablet 0   ? rivaroxaban (XARELTO) 10 mg tablet Take one tablet by mouth daily. 14 tablet 0   ? rizatriptan (MAXALT) 10 mg tablet TAKE 1 TABLET BY MOUTH EVERY 12 HOURS AS NEEDED FOR MIGRAINE     ? SAVELLA 25 mg tablet Take 25 mg by mouth twice daily.     ? sertraline (ZOLOFT) 50 mg tablet Take 1 tablet by mouth daily.           Review of Systems:    Positive: Left leg pain, burning, itching, tingling  Negative: Fevers    Otherwise, 10 point ROS  was negative except the pertinent information included in the HPI    Vital Signs:  Last Filed in 24 hours   BP: 135/108 (11/28 1600)  Temp: 36.9 ?C (98.4 ?F) (11/28 1247)  Respirations: 16 PER MINUTE (11/28 1247)  SpO2: 94 % (11/28 1600)  SpO2 Pulse: 92 (11/28 1600)     Physical Exam:    Constitutional: A&O, NAD  HEENT: EOMI, normocephalic  Respiratory:  Unlabored respirations  Cardiovascular: Regular rate  Skin: No open fractures, rashes, abrasions, lacerations on the LLE  Musculoskeletal: left lower extremity: Splint removed.  Skin intact and incisions with sutures in place with well-healing skin.  No periincisional erythema.  EHL/FHL function intact, states decreased sensation to soft touch throughout forefoot, palpable DP pulse, distal cap refill < 2 sec      Lab/Radiology/Other Diagnostic Tests:         Radiology: None obtained    CBC w/Diff   Lab Results Component Value Date/Time    WBC 11.5 (H) 01/31/2019 03:52 AM    HGB 13.3 01/31/2019 03:52 AM    HCT 38.9 01/31/2019 03:52 AM    PLTCT 210 01/31/2019 03:52 AM           Basic Metabolic Profile   Lab Results   Component Value Date/Time    NA 139 01/31/2019 03:52 AM    K 3.3 (L) 01/31/2019 03:52 AM    CL 108 01/31/2019 03:52 AM    CO2 23 01/31/2019 03:52 AM    GAP 8 01/31/2019 03:52 AM    BUN 15 01/31/2019 03:52 AM    CR 0.79 01/31/2019 03:52 AM    GLU 113 (H) 01/31/2019 03:52 AM        Coagulation Studies   Lab Results   Component Value Date/Time    PTT 29.0 01/30/2019 10:46 PM    INR 0.9 01/30/2019 10:46 PM

## 2019-09-20 NOTE — ED Notes
Bonnie James 43 y.o. presents to ED for cc of LLE pain/itching/swelling. Pt had a metatarsal fx surgery where they d/c her on xerlto. She then had an allergic reaction to it. PCP took pt off of anticoagulant. Pt concerned with DVT, allergic reaction to lower leg. Pt arrives in ortho glass, wrapped in ace bandage.      has a past medical history of Anxiety, Asperger's disorder, Back pain, Essential hypertension, benign, Fibromyalgia, Migraines, Multiple food allergies, Nausea, Spider bite, and Systolic dysfunction.

## 2019-09-21 NOTE — Progress Notes
Patient checked out to me from Dr. Penelope Galas pending Doppler to r/o DVT. Doppler negative. Ortho called and re-splinted. Ortho states to keep f/u appointment. Scripts per Dr. Glennon Mac and discharged stable.  Nona Dell MD  ED Attending

## 2019-09-26 ENCOUNTER — Encounter: Admit: 2019-09-26 | Discharge: 2019-09-26

## 2019-09-26 ENCOUNTER — Ambulatory Visit: Admit: 2019-09-26 | Discharge: 2019-09-26 | Payer: MEDICAID

## 2019-09-26 DIAGNOSIS — T63301A Toxic effect of unspecified spider venom, accidental (unintentional), initial encounter: Secondary | ICD-10-CM

## 2019-09-26 DIAGNOSIS — R110 Nausea: Secondary | ICD-10-CM

## 2019-09-26 DIAGNOSIS — M797 Fibromyalgia: Secondary | ICD-10-CM

## 2019-09-26 DIAGNOSIS — M549 Dorsalgia, unspecified: Secondary | ICD-10-CM

## 2019-09-26 DIAGNOSIS — I10 Essential (primary) hypertension: Secondary | ICD-10-CM

## 2019-09-26 DIAGNOSIS — Z91018 Allergy to other foods: Secondary | ICD-10-CM

## 2019-09-26 DIAGNOSIS — S92352G Displaced fracture of fifth metatarsal bone, left foot, subsequent encounter for fracture with delayed healing: Secondary | ICD-10-CM

## 2019-09-26 DIAGNOSIS — I519 Heart disease, unspecified: Secondary | ICD-10-CM

## 2019-09-26 DIAGNOSIS — F845 Asperger's syndrome: Secondary | ICD-10-CM

## 2019-09-26 DIAGNOSIS — F419 Anxiety disorder, unspecified: Secondary | ICD-10-CM

## 2019-09-26 DIAGNOSIS — G43909 Migraine, unspecified, not intractable, without status migrainosus: Secondary | ICD-10-CM

## 2019-09-26 NOTE — Patient Instructions
Add Multivitamin, Vitamin D3 2000 IU daily, and Magnesium citrate or chloride capsules 400- 500mg  daily       Please do not hesitate to contact my office with any questions.    Dr. Gaspar Bidding Vopat & Penelope Coop PA-C - Orthopedic Surgeon, Sports Medicine  [facility] - Phone [phone] - Fax [phone]   [address], [place], [place]    Jenell Milliner BSN, RN - Clinical Nurse Coordinator  Lupita Leash BSN, RN - Clinical Nurse Coordinator  Adan Sis MS, ATC, LAT - Clinical Athletic Trainer    Thank you for supporting our practice! Your feedback helps [place] deliver the highest quality of care.  Review [place] at: [email or web address]
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
## 2019-10-27 ENCOUNTER — Encounter: Admit: 2019-10-27 | Discharge: 2019-10-27

## 2019-10-27 DIAGNOSIS — Z9889 Other specified postprocedural states: Secondary | ICD-10-CM

## 2019-10-27 DIAGNOSIS — S92352G Displaced fracture of fifth metatarsal bone, left foot, subsequent encounter for fracture with delayed healing: Secondary | ICD-10-CM

## 2019-10-28 ENCOUNTER — Encounter: Admit: 2019-10-28 | Discharge: 2019-10-28

## 2019-10-28 ENCOUNTER — Ambulatory Visit: Admit: 2019-10-28 | Discharge: 2019-10-28 | Payer: MEDICAID

## 2019-10-28 ENCOUNTER — Ambulatory Visit: Admit: 2019-10-28 | Discharge: 2019-10-28

## 2019-10-28 DIAGNOSIS — T63301A Toxic effect of unspecified spider venom, accidental (unintentional), initial encounter: Secondary | ICD-10-CM

## 2019-10-28 DIAGNOSIS — I10 Essential (primary) hypertension: Secondary | ICD-10-CM

## 2019-10-28 DIAGNOSIS — F419 Anxiety disorder, unspecified: Secondary | ICD-10-CM

## 2019-10-28 DIAGNOSIS — M797 Fibromyalgia: Secondary | ICD-10-CM

## 2019-10-28 DIAGNOSIS — R110 Nausea: Secondary | ICD-10-CM

## 2019-10-28 DIAGNOSIS — G43909 Migraine, unspecified, not intractable, without status migrainosus: Secondary | ICD-10-CM

## 2019-10-28 DIAGNOSIS — S92352G Displaced fracture of fifth metatarsal bone, left foot, subsequent encounter for fracture with delayed healing: Secondary | ICD-10-CM

## 2019-10-28 DIAGNOSIS — Z9889 Other specified postprocedural states: Secondary | ICD-10-CM

## 2019-10-28 DIAGNOSIS — I519 Heart disease, unspecified: Secondary | ICD-10-CM

## 2019-10-28 DIAGNOSIS — M549 Dorsalgia, unspecified: Secondary | ICD-10-CM

## 2019-10-28 DIAGNOSIS — F845 Asperger's syndrome: Secondary | ICD-10-CM

## 2019-10-28 DIAGNOSIS — Z91018 Allergy to other foods: Secondary | ICD-10-CM

## 2019-10-28 MED ORDER — TRAMADOL 50 MG PO TAB
50 mg | ORAL_TABLET | ORAL | 0 refills | Status: AC | PRN
Start: 2019-10-28 — End: ?

## 2019-10-28 NOTE — Patient Instructions
Please do not hesitate to contact my office with any questions.    Dr. Bryan Vopat & Stephanie Caldwell PA-C - Orthopedic Surgeon, Sports Medicine  [facility] - Phone [phone] - Fax [phone]   [address], [address] - [place], [place] [address]    Johndavid Geralds BSN, RN - Clinical Nurse Coordinator  Casey Conover BSN, RN - Clinical Nurse Coordinator  Megan Burki MS, ATC, LAT - Clinical Athletic Trainer    Thank you for supporting our practice! Your feedback helps us deliver the highest quality of care.  Review us at: [email or web address]

## 2019-11-07 ENCOUNTER — Ambulatory Visit: Admit: 2019-11-07 | Discharge: 2019-11-07 | Payer: MEDICAID

## 2019-11-07 ENCOUNTER — Encounter: Admit: 2019-11-07 | Discharge: 2019-11-07

## 2019-11-07 ENCOUNTER — Ambulatory Visit: Admit: 2019-11-07 | Discharge: 2019-11-07

## 2019-11-07 DIAGNOSIS — I10 Essential (primary) hypertension: Secondary | ICD-10-CM

## 2019-11-07 DIAGNOSIS — S92352G Displaced fracture of fifth metatarsal bone, left foot, subsequent encounter for fracture with delayed healing: Secondary | ICD-10-CM

## 2019-11-07 DIAGNOSIS — F845 Asperger's syndrome: Secondary | ICD-10-CM

## 2019-11-07 DIAGNOSIS — Z91018 Allergy to other foods: Secondary | ICD-10-CM

## 2019-11-07 DIAGNOSIS — M549 Dorsalgia, unspecified: Secondary | ICD-10-CM

## 2019-11-07 DIAGNOSIS — Z9889 Other specified postprocedural states: Secondary | ICD-10-CM

## 2019-11-07 DIAGNOSIS — G43909 Migraine, unspecified, not intractable, without status migrainosus: Secondary | ICD-10-CM

## 2019-11-07 DIAGNOSIS — I519 Heart disease, unspecified: Secondary | ICD-10-CM

## 2019-11-07 DIAGNOSIS — R110 Nausea: Secondary | ICD-10-CM

## 2019-11-07 DIAGNOSIS — F419 Anxiety disorder, unspecified: Secondary | ICD-10-CM

## 2019-11-07 DIAGNOSIS — M797 Fibromyalgia: Secondary | ICD-10-CM

## 2019-11-07 DIAGNOSIS — T63301A Toxic effect of unspecified spider venom, accidental (unintentional), initial encounter: Secondary | ICD-10-CM

## 2019-11-07 NOTE — Patient Instructions
Please do not hesitate to contact my office with any questions.    Dr. Bryan Vopat & Stephanie Caldwell PA-C - Orthopedic Surgeon, Sports Medicine  [facility] - Phone [phone] - Fax [phone]   [address], [address] - [place], [place] [address]    Kara Schuessler BSN, RN - Clinical Nurse Coordinator  Aiyanna Awtrey BSN, RN - Clinical Nurse Coordinator  Megan Burki MS, ATC, LAT - Clinical Athletic Trainer    Thank you for supporting our practice! Your feedback helps us deliver the highest quality of care.  Review us at: [email or web address]

## 2020-01-29 ENCOUNTER — Emergency Department: Admit: 2020-01-29 | Discharge: 2020-01-29 | Payer: MEDICAID

## 2020-01-29 ENCOUNTER — Emergency Department: Admit: 2020-01-29 | Discharge: 2020-01-29

## 2020-01-29 ENCOUNTER — Encounter: Admit: 2020-01-29 | Discharge: 2020-01-29

## 2020-01-29 LAB — CBC AND DIFF: Lab: 7.2 10*3/uL (ref 4.5–11.0)

## 2020-01-29 LAB — POC TROPONIN: Lab: 0 ng/mL (ref 0.00–0.05)

## 2020-01-29 MED ORDER — FENTANYL CITRATE (PF) 50 MCG/ML IJ SOLN
50 ug | Freq: Once | INTRAVENOUS | 0 refills | Status: CP
Start: 2020-01-29 — End: ?
  Administered 2020-01-30: 03:00:00 50 ug via INTRAVENOUS

## 2020-01-29 MED ORDER — VANCOMYCIN 2,000 MG IVPB
2000 mg | Freq: Once | INTRAVENOUS | 0 refills | Status: CP
Start: 2020-01-29 — End: ?
  Administered 2020-01-30 (×2): 2000 mg via INTRAVENOUS

## 2020-01-29 MED ORDER — LACTATED RINGERS IV SOLP
1000 mL | Freq: Once | INTRAVENOUS | 0 refills | Status: DC
Start: 2020-01-29 — End: 2020-01-30

## 2020-01-29 MED ORDER — VANCOMYCIN 1,750 MG IVPB
15 mg/kg | Freq: Once | INTRAVENOUS | 0 refills | Status: DC
Start: 2020-01-29 — End: 2020-01-30

## 2020-01-29 MED ORDER — LACTATED RINGERS IV SOLP
500 mL | Freq: Once | INTRAVENOUS | 0 refills | Status: CP
Start: 2020-01-29 — End: ?
  Administered 2020-01-30: 03:00:00 500 mL via INTRAVENOUS

## 2020-01-29 MED ORDER — FUROSEMIDE 10 MG/ML IJ SOLN
20 mg | Freq: Once | INTRAVENOUS | 0 refills | Status: CP
Start: 2020-01-29 — End: ?
  Administered 2020-01-30: 05:00:00 20 mg via INTRAVENOUS

## 2020-01-30 ENCOUNTER — Encounter: Admit: 2020-01-30 | Discharge: 2020-01-30

## 2020-01-30 ENCOUNTER — Emergency Department: Admit: 2020-01-30 | Discharge: 2020-01-30

## 2020-01-30 LAB — BNP (B-TYPE NATRIURETIC PEPTI): Lab: 129 pg/mL — ABNORMAL HIGH (ref 0–100)

## 2020-01-30 LAB — COMPREHENSIVE METABOLIC PANEL
Lab: 0.7 mg/dL (ref 0.4–1.00)
Lab: 103 MMOL/L (ref 98–110)
Lab: 139 MMOL/L (ref 137–147)
Lab: 3.9 MMOL/L (ref 3.5–5.1)
Lab: 84 mg/dL (ref 70–100)

## 2020-01-30 LAB — D-DIMER: Lab: 494 ng{FEU}/mL (ref <500)

## 2020-01-30 LAB — C REACTIVE PROTEIN (CRP): Lab: 2.8 mg/dL — ABNORMAL HIGH (ref <1.0)

## 2020-01-30 LAB — SED RATE: Lab: 40 mm/h — ABNORMAL HIGH (ref 0–20)

## 2020-01-30 MED ORDER — CARVEDILOL 6.25 MG PO TAB
6.25 mg | Freq: Two times a day (BID) | ORAL | 0 refills | Status: DC
Start: 2020-01-30 — End: 2020-02-02
  Administered 2020-01-30 – 2020-02-02 (×7): 6.25 mg via ORAL

## 2020-01-30 MED ORDER — FAMOTIDINE 20 MG PO TAB
20 mg | Freq: Two times a day (BID) | ORAL | 0 refills | Status: DC
Start: 2020-01-30 — End: 2020-02-02
  Administered 2020-01-31 – 2020-02-02 (×6): 20 mg via ORAL

## 2020-01-30 MED ORDER — VANCOMYCIN PHARMACY TO MANAGE
1 | 0 refills | Status: DC
Start: 2020-01-30 — End: 2020-02-02

## 2020-01-30 MED ORDER — HYDROXYZINE HCL 25 MG PO TAB
25 mg | Freq: Three times a day (TID) | ORAL | 0 refills | Status: DC | PRN
Start: 2020-01-30 — End: 2020-02-02
  Administered 2020-01-31: 14:00:00 25 mg via ORAL

## 2020-01-30 MED ORDER — TRAMADOL 50 MG PO TAB
50 mg | ORAL | 0 refills | Status: DC | PRN
Start: 2020-01-30 — End: 2020-02-02
  Administered 2020-01-30 – 2020-02-02 (×12): 50 mg via ORAL

## 2020-01-30 MED ORDER — CETIRIZINE 10 MG PO TAB
10 mg | Freq: Every day | ORAL | 0 refills | Status: DC
Start: 2020-01-30 — End: 2020-02-02
  Administered 2020-01-30 – 2020-02-02 (×4): 10 mg via ORAL

## 2020-01-30 MED ORDER — CEFAZOLIN INJ 1GM IVP
2 g | INTRAVENOUS | 0 refills | Status: DC
Start: 2020-01-30 — End: 2020-01-31
  Administered 2020-01-30 – 2020-01-31 (×4): 2 g via INTRAVENOUS

## 2020-01-30 MED ORDER — LOSARTAN 50 MG PO TAB
50 mg | Freq: Every day | ORAL | 0 refills | Status: DC
Start: 2020-01-30 — End: 2020-02-02
  Administered 2020-01-30 – 2020-02-02 (×4): 50 mg via ORAL

## 2020-01-30 MED ORDER — ENOXAPARIN 40 MG/0.4 ML SC SYRG
40 mg | Freq: Two times a day (BID) | SUBCUTANEOUS | 0 refills | Status: DC
Start: 2020-01-30 — End: 2020-02-02
  Administered 2020-01-30 – 2020-02-02 (×7): 40 mg via SUBCUTANEOUS

## 2020-01-30 MED ORDER — ENOXAPARIN 40 MG/0.4 ML SC SYRG
40 mg | Freq: Every day | SUBCUTANEOUS | 0 refills | Status: DC
Start: 2020-01-30 — End: 2020-01-30

## 2020-01-30 MED ORDER — VANCOMYCIN 1,750 MG IVPB
15 mg/kg | Freq: Two times a day (BID) | INTRAVENOUS | 0 refills | Status: DC
Start: 2020-01-30 — End: 2020-01-30

## 2020-01-30 MED ORDER — SERTRALINE 50 MG PO TAB
50 mg | Freq: Every day | ORAL | 0 refills | Status: DC
Start: 2020-01-30 — End: 2020-02-02
  Administered 2020-01-30 – 2020-02-02 (×4): 50 mg via ORAL

## 2020-01-30 MED ORDER — FENTANYL CITRATE (PF) 50 MCG/ML IJ SOLN
25 ug | INTRAVENOUS | 0 refills | Status: DC | PRN
Start: 2020-01-30 — End: 2020-02-01
  Administered 2020-01-30 – 2020-02-01 (×17): 25 ug via INTRAVENOUS

## 2020-01-30 MED ORDER — ACETAMINOPHEN 325 MG PO TAB
650 mg | ORAL | 0 refills | Status: DC
Start: 2020-01-30 — End: 2020-01-30

## 2020-01-30 MED ORDER — VANCOMYCIN 1,500 MG IVPB
1500 mg | Freq: Two times a day (BID) | INTRAVENOUS | 0 refills | Status: DC
Start: 2020-01-30 — End: 2020-02-02
  Administered 2020-01-30 – 2020-02-02 (×12): 1500 mg via INTRAVENOUS

## 2020-01-30 MED ORDER — HYDROCODONE-ACETAMINOPHEN 5-325 MG PO TAB
1.5 | ORAL | 0 refills | Status: DC | PRN
Start: 2020-01-30 — End: 2020-01-31
  Administered 2020-01-30 – 2020-01-31 (×6): 1.5 via ORAL

## 2020-01-30 MED ORDER — ACETAMINOPHEN 325 MG PO TAB
325 mg | ORAL | 0 refills | Status: DC
Start: 2020-01-30 — End: 2020-01-31
  Administered 2020-01-30 – 2020-01-31 (×4): 325 mg via ORAL

## 2020-01-30 MED ORDER — FENTANYL CITRATE (PF) 50 MCG/ML IJ SOLN
25-50 ug | INTRAVENOUS | 0 refills | Status: DC | PRN
Start: 2020-01-30 — End: 2020-01-30
  Administered 2020-01-30: 12:00:00 50 ug via INTRAVENOUS

## 2020-01-30 MED ORDER — FUROSEMIDE 10 MG/ML IJ SOLN
40 mg | Freq: Once | INTRAVENOUS | 0 refills | Status: CP
Start: 2020-01-30 — End: ?
  Administered 2020-01-30: 21:00:00 40 mg via INTRAVENOUS

## 2020-01-30 MED ORDER — OXYCODONE 5 MG PO TAB
5-10 mg | ORAL | 0 refills | Status: DC | PRN
Start: 2020-01-30 — End: 2020-01-30
  Administered 2020-01-30 (×2): 10 mg via ORAL

## 2020-01-30 MED ORDER — FLUTICASONE PROPIONATE 50 MCG/ACTUATION NA SPSN
1 | Freq: Every day | NASAL | 0 refills | Status: DC
Start: 2020-01-30 — End: 2020-02-02
  Administered 2020-01-31: 14:00:00 1 via NASAL

## 2020-01-30 MED ORDER — SERTRALINE 50 MG PO TAB
50 mg | Freq: Every day | ORAL | 0 refills | Status: DC
Start: 2020-01-30 — End: 2020-01-30
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
## 2020-01-31 ENCOUNTER — Encounter: Admit: 2020-01-31 | Discharge: 2020-01-31

## 2020-01-31 MED ORDER — MORPHINE 15 MG PO TAB
15 mg | ORAL | 0 refills | Status: DC | PRN
Start: 2020-01-31 — End: 2020-02-02
  Administered 2020-01-31 – 2020-02-02 (×10): 15 mg via ORAL

## 2020-01-31 MED ORDER — POLYETHYLENE GLYCOL 3350 17 GRAM PO PWPK
2 | Freq: Two times a day (BID) | ORAL | 0 refills | Status: DC
Start: 2020-01-31 — End: 2020-02-02

## 2020-01-31 MED ORDER — ACETAMINOPHEN 500 MG PO TAB
1000 mg | Freq: Three times a day (TID) | ORAL | 0 refills | Status: DC
Start: 2020-01-31 — End: 2020-02-02
  Administered 2020-02-01 – 2020-02-02 (×5): 1000 mg via ORAL

## 2020-01-31 MED ORDER — HELP MEDICATION
Freq: Every day | 0 refills | Status: DC
Start: 2020-01-31 — End: 2020-01-31

## 2020-01-31 MED ORDER — FUROSEMIDE 10 MG/ML IJ SOLN
40 mg | Freq: Once | INTRAVENOUS | 0 refills | Status: CP
Start: 2020-01-31 — End: ?
  Administered 2020-01-31: 16:00:00 40 mg via INTRAVENOUS

## 2020-01-31 MED ORDER — SENNOSIDES-DOCUSATE SODIUM 8.6-50 MG PO TAB
2 | Freq: Two times a day (BID) | ORAL | 0 refills | Status: DC
Start: 2020-01-31 — End: 2020-02-02
  Administered 2020-02-02: 15:00:00 1 via ORAL
  Administered 2020-02-02: 02:00:00 2 via ORAL

## 2020-01-31 MED ORDER — MULTIVITAMIN, STRESS FORMULA PO TAB
1 | Freq: Every day | ORAL | 0 refills | Status: DC
Start: 2020-01-31 — End: 2020-02-02
  Administered 2020-01-31 – 2020-02-02 (×3): 1 via ORAL

## 2020-02-01 ENCOUNTER — Inpatient Hospital Stay: Admit: 2020-02-01 | Discharge: 2020-02-01

## 2020-02-01 MED ORDER — GADOBENATE DIMEGLUMINE 529 MG/ML (0.1MMOL/0.2ML) IV SOLN
20 mL | Freq: Once | INTRAVENOUS | 0 refills | Status: CP
Start: 2020-02-01 — End: ?
  Administered 2020-02-02: 05:00:00 20 mL via INTRAVENOUS

## 2020-02-01 MED ORDER — DIPHENHYDRAMINE HCL 50 MG PO CAP
50 mg | Freq: Once | ORAL | 0 refills | Status: CP | PRN
Start: 2020-02-01 — End: ?
  Administered 2020-02-02: 02:00:00 50 mg via ORAL

## 2020-02-01 MED ORDER — MORPHINE 2 MG/ML IV SYRG
2 mg | INTRAVENOUS | 0 refills | Status: DC | PRN
Start: 2020-02-01 — End: 2020-02-02
  Administered 2020-02-01 – 2020-02-02 (×6): 2 mg via INTRAVENOUS

## 2020-02-01 MED ORDER — METHYLPREDNISOLONE 16 MG PO TAB
32 mg | Freq: Once | ORAL | 0 refills | Status: CP | PRN
Start: 2020-02-01 — End: ?
  Administered 2020-02-01: 15:00:00 32 mg via ORAL

## 2020-02-01 MED ORDER — METHYLPREDNISOLONE 16 MG PO TAB
32 mg | Freq: Once | ORAL | 0 refills | Status: CP | PRN
Start: 2020-02-01 — End: ?
  Administered 2020-02-02: 01:00:00 32 mg via ORAL

## 2020-02-02 ENCOUNTER — Encounter: Admit: 2020-02-02 | Discharge: 2020-02-02

## 2020-02-02 DIAGNOSIS — T63301A Toxic effect of unspecified spider venom, accidental (unintentional), initial encounter: Secondary | ICD-10-CM

## 2020-02-02 DIAGNOSIS — R110 Nausea: Secondary | ICD-10-CM

## 2020-02-02 DIAGNOSIS — M549 Dorsalgia, unspecified: Secondary | ICD-10-CM

## 2020-02-02 DIAGNOSIS — I519 Heart disease, unspecified: Secondary | ICD-10-CM

## 2020-02-02 DIAGNOSIS — M797 Fibromyalgia: Secondary | ICD-10-CM

## 2020-02-02 DIAGNOSIS — I10 Essential (primary) hypertension: Secondary | ICD-10-CM

## 2020-02-02 DIAGNOSIS — G43909 Migraine, unspecified, not intractable, without status migrainosus: Secondary | ICD-10-CM

## 2020-02-02 DIAGNOSIS — Z91018 Allergy to other foods: Secondary | ICD-10-CM

## 2020-02-02 DIAGNOSIS — F419 Anxiety disorder, unspecified: Secondary | ICD-10-CM

## 2020-02-02 DIAGNOSIS — F845 Asperger's syndrome: Secondary | ICD-10-CM

## 2020-02-02 MED ORDER — SERTRALINE 50 MG PO TAB
50 mg | Freq: Every day | ORAL | 0 refills | Status: AC
Start: 2020-02-02 — End: ?

## 2020-02-02 MED ORDER — CARVEDILOL 6.25 MG PO TAB
6.25 mg | ORAL_TABLET | Freq: Two times a day (BID) | ORAL | 0 refills | Status: CN
Start: 2020-02-02 — End: ?

## 2020-02-02 MED ORDER — RIZATRIPTAN 10 MG PO TAB
10 mg | ORAL_TABLET | Freq: Every day | ORAL | 0 refills | Status: CN | PRN
Start: 2020-02-02 — End: ?

## 2020-02-02 MED ORDER — ONDANSETRON 4 MG PO TBDI
4 mg | Freq: Once | ORAL | 0 refills | Status: CP
Start: 2020-02-02 — End: ?
  Administered 2020-02-02: 17:00:00 4 mg via ORAL

## 2020-02-02 MED ORDER — LINEZOLID 600 MG PO TAB
600 mg | ORAL_TABLET | Freq: Two times a day (BID) | ORAL | 0 refills | Status: CN
Start: 2020-02-02 — End: ?

## 2020-02-02 MED FILL — LINEZOLID 600 MG PO TAB: 600 mg | ORAL | 10 days supply | Qty: 20 | Status: CN

## 2020-02-02 MED FILL — RIZATRIPTAN 10 MG PO TAB: 10 mg | ORAL | 30 days supply | Qty: 18 | Status: CN

## 2020-02-02 MED FILL — CARVEDILOL 6.25 MG PO TAB: 6.25 mg | ORAL | 30 days supply | Qty: 180 | Status: CN

## 2020-02-03 ENCOUNTER — Encounter: Admit: 2020-02-03 | Discharge: 2020-02-03

## 2020-02-04 ENCOUNTER — Encounter: Admit: 2020-02-04 | Discharge: 2020-02-04

## 2020-02-04 DIAGNOSIS — L03116 Cellulitis of left lower limb: Secondary | ICD-10-CM

## 2020-02-04 DIAGNOSIS — R197 Diarrhea, unspecified: Secondary | ICD-10-CM

## 2020-02-04 MED ORDER — SULFAMETHOXAZOLE-TRIMETHOPRIM 800-160 MG PO TAB
1 | ORAL_TABLET | Freq: Two times a day (BID) | ORAL | 0 refills | Status: DC
Start: 2020-02-04 — End: 2020-04-05

## 2020-02-06 ENCOUNTER — Encounter: Admit: 2020-02-06 | Discharge: 2020-02-06

## 2020-02-06 DIAGNOSIS — R197 Diarrhea, unspecified: Secondary | ICD-10-CM

## 2020-04-01 ENCOUNTER — Encounter: Admit: 2020-04-01 | Discharge: 2020-04-01

## 2020-04-05 ENCOUNTER — Encounter: Admit: 2020-04-05 | Discharge: 2020-04-05 | Primary: Family

## 2020-04-05 DIAGNOSIS — Z91018 Allergy to other foods: Secondary | ICD-10-CM

## 2020-04-05 DIAGNOSIS — I519 Heart disease, unspecified: Secondary | ICD-10-CM

## 2020-04-05 DIAGNOSIS — M797 Fibromyalgia: Secondary | ICD-10-CM

## 2020-04-05 DIAGNOSIS — G43909 Migraine, unspecified, not intractable, without status migrainosus: Secondary | ICD-10-CM

## 2020-04-05 DIAGNOSIS — F419 Anxiety disorder, unspecified: Secondary | ICD-10-CM

## 2020-04-05 DIAGNOSIS — I10 Essential (primary) hypertension: Secondary | ICD-10-CM

## 2020-04-05 DIAGNOSIS — R110 Nausea: Secondary | ICD-10-CM

## 2020-04-05 DIAGNOSIS — T63301A Toxic effect of unspecified spider venom, accidental (unintentional), initial encounter: Secondary | ICD-10-CM

## 2020-04-05 DIAGNOSIS — M549 Dorsalgia, unspecified: Secondary | ICD-10-CM

## 2020-04-05 DIAGNOSIS — F845 Asperger's syndrome: Secondary | ICD-10-CM

## 2020-04-05 DIAGNOSIS — R002 Palpitations: Secondary | ICD-10-CM

## 2020-04-05 DIAGNOSIS — I5189 Other ill-defined heart diseases: Secondary | ICD-10-CM
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
## 2020-04-06 ENCOUNTER — Encounter: Admit: 2020-04-06 | Discharge: 2020-04-06 | Primary: Family

## 2020-04-19 ENCOUNTER — Ambulatory Visit: Admit: 2020-04-19 | Discharge: 2020-04-19 | Primary: Family

## 2020-04-19 ENCOUNTER — Encounter: Admit: 2020-04-19 | Discharge: 2020-04-19 | Primary: Family

## 2020-04-19 MED ORDER — PERFLUTREN LIPID MICROSPHERES 1.1 MG/ML IV SUSP
1-20 mL | Freq: Once | INTRAVENOUS | 0 refills | Status: CP | PRN
Start: 2020-04-19 — End: ?

## 2020-04-22 ENCOUNTER — Encounter: Admit: 2020-04-22 | Discharge: 2020-04-22 | Primary: Family

## 2020-04-22 NOTE — Telephone Encounter
-----   Message from Valora Piccolo, RN sent at 04/22/2020 11:25 AM CDT -----    ----- Message -----  From: Lessie Dings, MD  Sent: 04/22/2020  11:23 AM CDT  To: Cvm Nurse Gen Card Team [place]    Please call patient with results of her resting echo Doppler study. Good news is that she has had slight further improvement in global LV systolic function. Her LVEF is now at the low end of normal. Continue current medical therapy.  ----- Message -----  From: Glynda Jaeger, MD  Sent: 04/19/2020   4:00 PM CDT  To: Lessie Dings, MD

## 2020-04-22 NOTE — Telephone Encounter
Patient notified of echo results and recommendations. Patient does not have any further questions or concerns at this time.

## 2020-08-20 ENCOUNTER — Encounter: Admit: 2020-08-20 | Discharge: 2020-08-20 | Primary: Family

## 2020-08-20 DIAGNOSIS — I5189 Other ill-defined heart diseases: Secondary | ICD-10-CM

## 2020-08-20 MED ORDER — LOSARTAN 25 MG PO TAB
ORAL_TABLET | Freq: Every day | ORAL | 3 refills | 30.00000 days | Status: AC
Start: 2020-08-20 — End: ?

## 2020-09-28 ENCOUNTER — Encounter: Admit: 2020-09-28 | Discharge: 2020-09-28 | Primary: Family

## 2020-11-05 ENCOUNTER — Encounter: Admit: 2020-11-05 | Discharge: 2020-11-05 | Primary: Family

## 2020-11-05 DIAGNOSIS — I5189 Other ill-defined heart diseases: Secondary | ICD-10-CM

## 2020-11-05 MED ORDER — LOSARTAN 50 MG PO TAB
ORAL_TABLET | Freq: Every day | ORAL | 0 refills | 30.00000 days | Status: AC
Start: 2020-11-05 — End: ?

## 2020-11-05 MED ORDER — CARVEDILOL 6.25 MG PO TAB
ORAL_TABLET | Freq: Two times a day (BID) | ORAL | 0 refills | 90.00000 days | Status: AC
Start: 2020-11-05 — End: ?

## 2020-12-29 ENCOUNTER — Encounter: Admit: 2020-12-29 | Discharge: 2020-12-29 | Primary: Family

## 2020-12-29 ENCOUNTER — Encounter: Admit: 2020-12-29 | Discharge: 2020-12-29 | Payer: MEDICAID | Primary: Family

## 2020-12-29 DIAGNOSIS — Z006 Encounter for examination for normal comparison and control in clinical research program: Secondary | ICD-10-CM

## 2020-12-29 LAB — RESEARCH BLOOD COLLECTION ONLY

## 2020-12-29 LAB — COMPREHENSIVE METABOLIC PANEL
Lab: 0.3 mg/dL (ref 0.3–1.2)
Lab: 0.9 mg/dL (ref 0.4–1.00)
Lab: 103 MMOL/L (ref 98–110)
Lab: 11 mg/dL (ref 7–25)
Lab: 138 MMOL/L (ref 137–147)
Lab: 19 U/L (ref 7–56)
Lab: 20 U/L (ref 7–40)
Lab: 4.2 g/dL (ref 3.5–5.0)
Lab: 4.7 MMOL/L (ref 3.5–5.1)
Lab: 7 g/dL (ref 6.0–8.0)
Lab: 8 (ref 3–12)
Lab: 91 U/L (ref 25–110)
Lab: >60 mL/min (ref >60)

## 2020-12-29 LAB — CBC
Lab: 289 K/UL (ref 150–400)
Lab: 4.2 M/UL (ref 4.0–5.0)
Lab: 7.5 K/UL (ref 4.5–11.0)

## 2020-12-29 NOTE — Research Notes
Research Informed Consent Note & Study Visit    NAME: Bonnie James             MRN: [id number]             DOB:[birth year]          AGE: 45 y.o.    Study: Ghrelin (OXE-103) for acute concussion management  Intervention: OXE-103 subcutaneous injections twice daily for two weeks  IRB Number: [id number]  Consent Approval Dates:  11/02/2020-05/03/2021    Clinical research participation and research nature of the trial were discussed with subject. The subject was alert and oriented during consent discussion and was accompanied by her spouse. Subject was informed that study is voluntary and  she may withdraw consent at any time for any reason by notifying study team.  Study purpose, procedures, benefits, risks and duration of the study, confidentiality information and compensation were discussed.  Alternatives to participation were discussed per consent form.  Subject verbalized understanding. Discussed contraception requirement with subject; subject confirms she has had a hysterectomy.    Subject was provided time to review the consent form.  All questions asked were answered.  Subject voiced desire to participate in the study and signed the informed consent form without coercion and undue influence.  A copy of the signed consent was given to the subject as well as contact information for the study team.  A copy of the consent form will be scannned into the subject's medical record.    No research procedures took place prior to consenting.    After consent, proceeded with Day 1 study visit.  Confirmed subject not fasting for labs.  Completed all study related procedures per protocol.  Medical history, medications, treatments, and adverse events reviewed per protocol.     Next due: Day 4 (phone); Day 8 (clinic).

## 2020-12-29 NOTE — Progress Notes
Neuro study examination:    Physical Exam  Vitals reviewed.   Constitutional:       Appearance: Normal appearance.   HENT:      Head: Normocephalic and atraumatic.   Cardiovascular:      Rate and Rhythm: Normal rate and regular rhythm.   Pulmonary:      Breath sounds: Normal breath sounds.   Musculoskeletal:         General: No swelling    Neuro Exam:  Mental Status: A&Ox3  Speech: fluent without dysarthria  CN: visual fields full to confrontation, PERRLA, EOMI, no facial numbness, strength of facial muscles is full and symmetric, hearing intact to conversation, symmetric palate elevation, tongue midline  Motor: normal tone/bulk, strength 5/5 throughout  Sensory: grossly intact to light touch/pin prick throughout  DTRs: 2/4 and symmetric bilaterally, toes down  Coordination: normal finger to nose bilaterally  Gait: normal

## 2021-01-01 ENCOUNTER — Encounter: Admit: 2021-01-01 | Discharge: 2021-01-01 | Primary: Family

## 2021-01-01 NOTE — Research Notes
Research Study Telephone Note    NAME: Guerline Happ             MRN: [id number]             DOB:26-May-1976         [facility] Number: [id number]  Study: Ghrelin (OXE-103) for acute concussion management  Intervention: OXE-103 subcutaneous injections twice daily for two weeks    Spoke with study participant for study visit day 4.  Completed all study related procedures per protocol.  Medical history, medications, treatments, and adverse events reviewed per protocol.     Next due: Day 8.

## 2021-01-05 ENCOUNTER — Encounter: Admit: 2021-01-05 | Discharge: 2021-01-05 | Payer: MEDICAID | Primary: Family

## 2021-01-05 ENCOUNTER — Encounter: Admit: 2021-01-05 | Discharge: 2021-01-05 | Primary: Family

## 2021-01-05 DIAGNOSIS — Z006 Encounter for examination for normal comparison and control in clinical research program: Secondary | ICD-10-CM

## 2021-01-05 LAB — COMPREHENSIVE METABOLIC PANEL
Lab: 0.4 mg/dL (ref 0.3–1.2)
Lab: 0.8 mg/dL (ref 0.4–1.00)
Lab: 11 (ref 3–12)
Lab: 138 MMOL/L (ref 137–147)
Lab: 21 U/L (ref 7–40)
Lab: 21 U/L (ref 7–56)
Lab: 23 MMOL/L (ref 21–30)
Lab: 4 g/dL (ref 3.5–5.0)
Lab: 4.4 MMOL/L (ref 3.5–5.1)
Lab: 6.8 g/dL (ref 6.0–8.0)
Lab: 8 mg/dL (ref 7–25)
Lab: 84 mg/dL (ref 70–100)
Lab: 85 U/L (ref 25–110)
Lab: 9.5 mg/dL (ref 8.5–10.6)
Lab: >60 mL/min (ref >60)

## 2021-01-05 LAB — RESEARCH BLOOD COLLECTION ONLY

## 2021-01-05 LAB — CBC
Lab: 4.3 M/UL (ref 4.0–5.0)
Lab: 7.8 K/UL (ref 4.5–11.0)

## 2021-01-05 NOTE — Progress Notes
Neuro study examination:    Physical Exam  Vitalsreviewed.   Constitutional:   Appearance: Normal appearance.   HENT:   Head: Normocephalicand atraumatic.   Cardiovascular:   Rate and Rhythm: Normal rateand regular rhythm.   Pulmonary:   Breath sounds: Normal breath sounds.   Musculoskeletal:   General: No swelling    Neuro Exam:  Mental Status: A&Ox3  Speech: fluent without dysarthria  CN: visual fields full to confrontation, PERRLA, EOMI, no facial numbness, strength of facial muscles is full and symmetric, hearing intact to conversation, symmetric palate elevation, tongue midline  Motor: normal tone/bulk, strength 5/5 throughout  Sensory: grossly intact to light touch/pin prick throughout  DTRs: 2/4 and symmetric bilaterally, toes down  Coordination: normal finger to nose bilaterally  Gait: normal

## 2021-01-08 ENCOUNTER — Encounter: Admit: 2021-01-08 | Discharge: 2021-01-08 | Primary: Family

## 2021-01-08 NOTE — Research Notes
Research Study Telephone Note    NAME: Bonnie James             MRN: [id number]             DOB:Jul 10, 1976         [facility] Number: [id number]  Study: Ghrelin (OXE-103) for acute concussion management  Intervention: OXE-103 subcutaneous injections twice daily for two weeks    Spoke with study participant for study visit day 11 phone check-in.  Completed all study related procedures per protocol.  Medical history, medications, treatments, and adverse events reviewed per protocol.     Next due: Day 15.

## 2021-01-14 ENCOUNTER — Encounter: Admit: 2021-01-14 | Discharge: 2021-01-14 | Primary: Family

## 2021-01-14 ENCOUNTER — Encounter: Admit: 2021-01-14 | Discharge: 2021-01-14 | Payer: MEDICAID | Primary: Family

## 2021-01-14 DIAGNOSIS — Z006 Encounter for examination for normal comparison and control in clinical research program: Principal | ICD-10-CM

## 2021-01-14 LAB — COMPREHENSIVE METABOLIC PANEL
Lab: 0.5 mg/dL (ref 0.3–1.2)
Lab: 0.9 mg/dL (ref 0.4–1.00)
Lab: 10 mg/dL (ref 7–25)
Lab: 10 mg/dL (ref 8.5–10.6)
Lab: 101 U/L (ref 25–110)
Lab: 13 — ABNORMAL HIGH (ref 3–12)
Lab: 137 MMOL/L — ABNORMAL HIGH (ref 137–147)
Lab: 24 MMOL/L (ref 21–30)
Lab: 26 U/L (ref 7–40)
Lab: 38 U/L (ref 7–56)
Lab: 4.4 MMOL/L — ABNORMAL HIGH (ref 3.5–5.1)
Lab: 4.5 g/dL (ref 3.5–5.0)
Lab: 7.6 g/dL (ref 6.0–8.0)
Lab: 84 mg/dL (ref 70–100)
Lab: >60 mL/min (ref >60)

## 2021-01-14 LAB — CBC
Lab: 4.8 M/UL (ref 4.0–5.0)
Lab: 6.2 K/UL (ref 4.5–11.0)

## 2021-01-14 LAB — RESEARCH BLOOD COLLECTION ONLY

## 2021-01-14 NOTE — Research Notes
Research Study Visit Note    NAME: Emmanuella Mirante             MRN: [id number]             DOB:1975-12-13         [facility] Number: [id number]  Study: Ghrelin (OXE-103) for acute concussion management  Intervention: OXE-103 subcutaneous injections twice daily for two weeks    Study participant here for study visit day 15.  Confirmed subject not fasting for labs.  Completed all study related procedures per protocol.  Medical history, medications, treatments, and adverse events reviewed per protocol.     Next due: Day 21.

## 2021-01-14 NOTE — Progress Notes
Neuro study examination:    Physical Exam  Vitalsreviewed.   Constitutional:   Appearance: Normal appearance.   HENT:   Head: Normocephalicand atraumatic.   Cardiovascular:   Rate and Rhythm: Normal rateand regular rhythm.   Pulmonary:   Breath sounds: Normal breath sounds.   Musculoskeletal:   General: No swelling    Neuro Exam:  Mental Status: A&Ox3  Speech: fluent without dysarthria  CN: visual fields full to confrontation, PERRLA, EOMI, no facial numbness, strength of facial muscles is full and symmetric, hearing intact to conversation, symmetric palate elevation, tongue midline  Motor: normal tone/bulk, strength 5/5 throughout  Sensory: grossly intact to light touch/pin prick throughout  DTRs: 2/4 and symmetric bilaterally, toes down  Coordination: normal finger to nose bilaterally  Gait: normal

## 2021-01-20 ENCOUNTER — Encounter: Admit: 2021-01-20 | Discharge: 2021-01-20 | Payer: MEDICAID | Primary: Family

## 2021-01-20 ENCOUNTER — Encounter: Admit: 2021-01-20 | Discharge: 2021-01-20 | Primary: Family

## 2021-01-20 DIAGNOSIS — Z006 Encounter for examination for normal comparison and control in clinical research program: Secondary | ICD-10-CM

## 2021-01-20 LAB — COMPREHENSIVE METABOLIC PANEL
Lab: 0.4 mg/dL (ref 0.3–1.2)
Lab: 0.8 mg/dL (ref 0.4–1.00)
Lab: 10 mg/dL (ref 7–25)
Lab: 10 mg/dL (ref 8.5–10.6)
Lab: 11 (ref 3–12)
Lab: 140 MMOL/L — ABNORMAL HIGH (ref 137–147)
Lab: 20 U/L (ref 7–40)
Lab: 21 U/L (ref 7–56)
Lab: 26 MMOL/L (ref 21–30)
Lab: 4.4 MMOL/L — ABNORMAL HIGH (ref 3.5–5.1)
Lab: 4.4 g/dL (ref 3.5–5.0)
Lab: 7.3 g/dL (ref 6.0–8.0)
Lab: 91 U/L (ref 25–110)
Lab: 93 mg/dL (ref 70–100)
Lab: >60 mL/min (ref >60)

## 2021-01-20 LAB — CBC
Lab: 4.8 M/UL (ref 4.0–5.0)
Lab: 9.1 K/UL (ref 4.5–11.0)

## 2021-01-20 LAB — RESEARCH BLOOD COLLECTION ONLY

## 2021-01-20 NOTE — Research Notes
Research Study Visit Note    NAME: Bonnie James             MRN: [id number]             DOB:1975/12/17         [facility] Number: [id number]  Study: Ghrelin (OXE-103) for acute concussion management  Intervention: OXE-103 subcutaneous injections twice daily for two weeks    Study participant here for study visit day 21.  Confirmed subject not fasting for labs.  Completed all study related procedures per protocol.  Medical history, medications, treatments, and adverse events reviewed per protocol.     Next due: Day 44 (last study visit).

## 2021-01-20 NOTE — Progress Notes
Neuro study examination:    Physical Exam  Vitalsreviewed.   Constitutional:   Appearance: Normal appearance.   HENT:   Head: Normocephalicand atraumatic.   Cardiovascular:   Rate and Rhythm: Normal rateand regular rhythm.   Pulmonary:   Breath sounds: Normal breath sounds.   Musculoskeletal:   General: No swelling    Neuro Exam:  Mental Status: A&Ox3  Speech: fluent without dysarthria  CN: visual fields full to confrontation, PERRLA, EOMI, no facial numbness, strength of facial muscles is full and symmetric, hearing intact to conversation, symmetric palate elevation, tongue midline  Motor: normal tone/bulk, strength 5/5 throughout  Sensory: grossly intact to light touch/pin prick throughout  DTRs: 2/4 and symmetric bilaterally, toes down  Coordination: normal finger to nose bilaterally  Gait: normal

## 2021-02-01 ENCOUNTER — Encounter: Admit: 2021-02-01 | Discharge: 2021-02-01 | Primary: Family

## 2021-02-01 DIAGNOSIS — I5189 Other ill-defined heart diseases: Secondary | ICD-10-CM

## 2021-02-01 MED ORDER — LOSARTAN 50 MG PO TAB
ORAL_TABLET | Freq: Every day | ORAL | 0 refills | 90.00000 days | Status: AC
Start: 2021-02-01 — End: ?

## 2021-02-10 ENCOUNTER — Encounter: Admit: 2021-02-10 | Discharge: 2021-02-10 | Primary: Family

## 2021-02-10 ENCOUNTER — Encounter: Admit: 2021-02-10 | Discharge: 2021-02-10 | Payer: MEDICAID | Primary: Family

## 2021-02-10 DIAGNOSIS — Z006 Encounter for examination for normal comparison and control in clinical research program: Secondary | ICD-10-CM

## 2021-02-10 LAB — COMPREHENSIVE METABOLIC PANEL
Lab: 0.4 mg/dL (ref 0.3–1.2)
Lab: 0.8 mg/dL (ref 0.4–1.00)
Lab: 100 U/L (ref 25–110)
Lab: 11 (ref 3–12)
Lab: 138 MMOL/L — ABNORMAL HIGH (ref 137–147)
Lab: 16 U/L (ref 7–56)
Lab: 17 U/L (ref 7–40)
Lab: 22 MMOL/L (ref 21–30)
Lab: 4.3 g/dL (ref 3.5–5.0)
Lab: 4.4 MMOL/L — ABNORMAL HIGH (ref 3.5–5.1)
Lab: 7.1 g/dL (ref 6.0–8.0)
Lab: 9 mg/dL (ref 7–25)
Lab: >60 mL/min (ref >60)

## 2021-02-10 LAB — CBC
Lab: 31 pg (ref 26–34)
Lab: 310 K/UL (ref 150–400)
Lab: 4.7 M/UL (ref 4.0–5.0)
Lab: 7.4 K/UL (ref 4.5–11.0)

## 2021-02-10 LAB — RESEARCH BLOOD COLLECTION ONLY

## 2021-02-10 NOTE — Progress Notes
Neuro study examination:    Physical Exam  Vitalsreviewed.   Constitutional:   Appearance: Normal appearance.   HENT:   Head: Normocephalicand atraumatic.   Cardiovascular:   Rate and Rhythm: Normal rateand regular rhythm.   Pulmonary:   Breath sounds: Normal breath sounds.   Musculoskeletal:   General: No swelling    Neuro Exam:  Mental Status: A&Ox3  Speech: fluent without dysarthria  CN: visual fields full to confrontation, PERRLA, EOMI, no facial numbness, strength of facial muscles is full and symmetric, hearing intact to conversation, symmetric palate elevation, tongue midline  Motor: normal tone/bulk, strength 5/5 throughout  Sensory: grossly intact to light touch/pin prick throughout  DTRs: 2/4 and symmetric bilaterally, toes down  Coordination: normal finger to nose bilaterally  Gait: normal

## 2021-09-13 ENCOUNTER — Encounter: Admit: 2021-09-13 | Discharge: 2021-09-13 | Payer: MEDICAID | Primary: Family

## 2021-09-20 ENCOUNTER — Encounter: Admit: 2021-09-20 | Discharge: 2021-09-20 | Payer: MEDICAID | Primary: Family

## 2021-09-20 DIAGNOSIS — R252 Cramp and spasm: Secondary | ICD-10-CM

## 2021-09-20 DIAGNOSIS — U099 COVID-19 long hauler: Secondary | ICD-10-CM

## 2021-09-20 DIAGNOSIS — R000 Tachycardia, unspecified: Secondary | ICD-10-CM

## 2021-09-20 DIAGNOSIS — I10 Essential (primary) hypertension: Secondary | ICD-10-CM

## 2021-09-20 MED ORDER — SPIRONOLACTONE 25 MG PO TAB
25 mg | ORAL_TABLET | Freq: Every day | ORAL | 1 refills | 90.00000 days | Status: AC
Start: 2021-09-20 — End: ?

## 2021-12-29 ENCOUNTER — Encounter: Admit: 2021-12-29 | Discharge: 2021-12-29 | Payer: MEDICAID | Primary: Family

## 2021-12-29 MED ORDER — SCOPOLAMINE BASE 1 MG OVER 3 DAYS TD PT3D
1 | MEDICATED_PATCH | TRANSDERMAL | 1 refills | Status: AC
Start: 2021-12-29 — End: ?

## 2021-12-29 MED ORDER — PRAZOSIN 1 MG PO CAP
1 mg | ORAL_CAPSULE | Freq: Every evening | ORAL | 1 refills | Status: AC
Start: 2021-12-29 — End: ?

## 2021-12-29 NOTE — Patient Instructions
Start scopolamine patch for motion sickness symptoms  Start Prazosin, 1 tablet at night for sleep disorder and high blood pressure  Return visit in 3-4 months

## 2021-12-29 NOTE — Progress Notes
Date of Service: 12/29/2021    Bonnie James is a 46 y.o. female.       HPI     Patient is a 46 year old Caucasian female past medical history of hypertension, low normal to mild decreased left ventricular ejection fraction of unknown etiology, posttraumatic stress disorder, psychosomatic issues, long-haul COVID with persistent tachycardia who presents for follow-up with reported increased somatic symptoms.  Has been having color change changes, temperature changes, sensation changes especially in her feet intermittently.  Struggled more with nausea, diarrhea, and a sense of motion sickness with position change for last several days.  States that several of her children are going through health issues of their own, continues to work through [employer] and relationships of one of her children's father as well as her own wellbeing.  States she came off the spironolactone because of excessive urination.  Currently is on amlodipine only for blood pressure control for last few months.  Struggles a lot with nighttime nightmares and difficulty with sleep.  Does have chest pain that is associated with her GI upset and stress symptoms.  Reports she was in the emergency room earlier this month because of the severity of her symptoms.  I am able to review those records where the ECG showed sinus tachycardia, blood work for the most part was stable, imaging of the chest did not show any acute pathology.  Patient was managed supportively dismissed home.         Vitals:    12/29/21 1422   BP: (!) 136/92   BP Source: Arm, Left Upper   Pulse: 108   SpO2: 96%   O2 Device: None (Room air)   PainSc: Zero   Weight: 105.2 kg (232 lb)   Height: 167.6 cm (5' 6)     Body mass index is 37.45 kg/m?Marland Kitchen     Past Medical History  Patient Active Problem List    Diagnosis Date Noted   ? Cellulitis of left lower extremity 02/02/2020   ? Edema of left lower extremity 01/30/2020   ? Closed displaced fracture of fifth metatarsal bone of left foot 09/10/2019   ? Suspected COVID-19 virus infection 01/30/2019   ? Chest pain 10/07/2018   ? Palpitations 10/07/2018     11-07-2018 Holter Monitor   A 48-hour Holter monitor demonstrates sinus at 68-165 bpm with a mean of 106 bpm. The average rate is somewhat fast. There are 1295 isolated APCs which is 0.4% of all beats. There are 36 atrial couplets. There are 18 runs of atrial tachycardia for a total of 72 beats (average 4 beats per run). The longest run is 5 beats. There is no A. fib or a flutter. There is a single isolated PVC with no couplets and no runs. AV conduction is normal. There are no pauses. There are no diary entries or patient triggers     ? 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 Normal LV size.  Borderline systolic function with EF about 45 to 50%  2. Unable to assess diastolic function  3. Normal right ventricular size and systolic function  4. Normal left atrial size  5. No significant functional valvular abnormalities  6. Normal central venous pressure  7. Unable to assess pulmonary artery systolic pressure due to inadequate TR jet  8. Only the very beginning of the ascending aortic root was seen up to the sinotubular junction and is normal size.  Further distally the ascending aorta  was not seen in the study and cannot be commented on  9. No pericardial effusion  ?  09-01-2019 ECHO D:   ? Left Ventricle: The left ventricular size is normal. Concentric remodeling. Poor LV endocardial border visualization, systolic function appears to be mildly to moderately reduced, visually EF 40-45%.Mild global hypokinesis slightly more prominent in the septum. No major changes compared to prior study dated 09/26/2018. Grade I (mild) left ventricular diastolic dysfunction.  ? Right Ventricle: The right ventricular size is normal. The right ventricular systolic function is normal.  ? No hemodynamically significant valvular abnormalities.  ? Normal central venous pressure (0-5 mm Hg).     ? Migraine with aura and without status migrainosus, not intractable 09/24/2017   ? Intractable chronic migraine without aura and without status migrainosus 09/24/2017   ? Essential hypertension 09/24/2017     Regadenoson MPI Stress Test - 09/26/18 at [facility] - This myocardial post imaging study is abnormal due to decreased left ventricular systolic function and also possible EKG changes.  The tomographic pattern was negative for ischemia, there are no fixed or reversible perfusion abnormality.  Significant anteroseptal wall reversible distribution was present due to severe breast artifact.  True ischemia did not appear to be present on the study.  The tomographic images also revealed significant diaphragmatic and bowel artifact.  Mildly depressed left ventricular systolic function, ejection fraction 44%, global hypokinesis. In regards to patient's resting and stress EKG, although difficult to determine on the EKG is available for review, the QTc was borderline elevated at rest (491 ms), and a heart rate of 82 bpm, with exercise it was approximately 537 ms at a heart rate of 103 bpm.  Patient should undergo further evaluation of these findings     ? Spondylosis of cervical region without myelopathy or radiculopathy 09/24/2017         Review of Systems   Constitutional: Negative.   HENT: Negative.    Eyes: Negative.    Cardiovascular: Positive for chest pain and leg swelling.   Respiratory: Negative.    Endocrine: Negative.    Hematologic/Lymphatic: Negative.    Skin: Negative.    Musculoskeletal: Negative.    Gastrointestinal: Positive for abdominal pain and diarrhea.   Genitourinary: Negative.    Neurological: Negative.    Psychiatric/Behavioral: Negative.    Allergic/Immunologic: Negative.        Physical Exam    Awake alert, in good spirits but you can tell very stressed, pupils are equal react without scleral injection  Neck is supple with normal Cropp stroke no bruits  Heart S1, S2 no obvious murmurs clicks or gallops.  Pulse is rapid but regular and symmetric bilaterally at radial as well as pedal location  The left foot is cool to touch and more purple in color.  Pulses intact and is not sensitive to touch  Abdomen is flat and nontender.  Patient does exhibit change in equilibrium and lightheaded sensation with change in position.  This resolves with several minutes.    Cardiovascular Studies      Cardiovascular Health Factors  Vitals BP Readings from Last 3 Encounters:   12/29/21 (!) 136/92   09/20/21 114/78   04/19/20 (!) 148/108     Wt Readings from Last 3 Encounters:   12/29/21 105.2 kg (232 lb)   09/20/21 98.9 kg (218 lb)   04/19/20 103.4 kg (228 lb)     BMI Readings from Last 3 Encounters:   12/29/21 37.45 kg/m?   09/20/21 35.19 kg/m?  04/19/20 36.80 kg/m?      Smoking Social History     Tobacco Use   Smoking Status Every Day   ? Packs/day: 1.00   ? Years: 15.00   ? Pack years: 15.00   ? Types: Cigarettes   Smokeless Tobacco Never      Lipid Profile Cholesterol   Date Value Ref Range Status   01/30/2020 161 <200 MG/DL Final     HDL   Date Value Ref Range Status   01/30/2020 36 (L) >40 MG/DL Final     LDL   Date Value Ref Range Status   01/30/2020 104 (H) <100 mg/dL Final     Triglycerides   Date Value Ref Range Status   01/30/2020 199 (H) <150 MG/DL Final      Blood Sugar Hemoglobin A1C   Date Value Ref Range Status   01/30/2020 5.4 4.0 - 6.0 % Final     Comment:     The ADA recommends that most patients with type 1 and type 2 diabetes maintain   an A1c level <7%.       Glucose   Date Value Ref Range Status   02/10/2021 96 70 - 100 MG/DL Final   29/56/2130 93 70 - 100 MG/DL Final   86/57/8469 84 70 - 100 MG/DL Final          Problems Addressed Today  Encounter Diagnoses   Name Primary?   ? Essential hypertension Yes   ? Tachycardia    ? COVID-19 long hauler    ? 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 Motion sickness, initial encounter    ? Sleep disorder        Assessment and Plan     Worsening psychosomatic symptoms.  In order to try to better maintain her symptoms and help alleviate some of the stress response I am going to start her on the prazosin at 1 mg p.o. nightly.  This should help some with blood pressure but more with sleep disorder.  I am also going to put her on scopolamine patch to help with motion sickness symptoms.  I made no other changes to her medications.  Like her to contact my office the next 2 to 3 weeks regarding response to these medications.  Reassess in the clinic at 3 to 4 months.  In review of her tachycardia and other cardiac findings no new changes or findings.  Continues to be consistent with reactive tachycardia.  Continue to treat risk factor for cardiovascular disease.         Current Medications (including today's revisions)  ? acetaminophen (TYLENOL) 325 mg tablet Take one tablet by mouth every 4 hours as needed for Pain.   ? albuterol 0.5% (PROVENTIL; VENTOLIN) 2.5 mg/0.5 mL nebulizer solution Inhale 0.5 mL solution by nebulizer as directed every 6 hours as needed for Shortness of Breath or Wheezing.   ? albuterol sulfate (PROAIR HFA) 90 mcg/actuation aerosol inhaler Inhale one puff by mouth into the lungs every 4 hours. Shake well before use.   ? ALPRAZolam (XANAX) 0.5 mg tablet Take one tablet by mouth as Needed.   ? amLODIPine (NORVASC) 10 mg tablet Take one-half tablet by mouth daily.   ? baclofen (LIORESAL) 20 mg tablet Take one tablet by mouth at bedtime daily.   ? benzonatate (TESSALON PERLES) 100 mg capsule Take one capsule by mouth three times daily as needed for Cough.   ? brexpiprazole (REXULTI) 2 mg tablet Take one tablet by  mouth daily.   ? carvediloL (COREG) 6.25 mg tablet Take 1 tablet by mouth twice daily with food   ? cetirizine (ZYRTEC) 10 mg tablet Take one tablet by mouth daily.   ? dextroamphetamine-amphetamine (ADDERALL) 5 mg tablet Take one tablet by mouth daily.   ? diphenhydrAMINE (BENADRYL) 25 mg capsule Take one capsule by mouth every 6 hours.   ? EPINEPHrine (EPIPEN) 1 mg/mL injection pen (2-Pack) Inject 0.3 mL into the muscle once as needed. Inject 0.3 mg (1 Pen) into thigh if needed for anaphylactic reaction. May repeat in 5-15 minutes if needed.   ? fluticasone (FLONASE) 50 mcg/actuation nasal spray Apply  to each nostril as directed daily. Shake bottle gently before using.   ? furosemide (LASIX) 20 mg tablet Take one tablet by mouth daily.   ? hydrOXYzine pamoate (VISTARIL) 25 mg capsule Every 4-6 Hours As Needed as needed for For Allergies   ? lisdexamfetamine (VYVANSE) 60 mg capsule Take one capsule by mouth every 24 hours.   ? losartan (COZAAR) 25 mg tablet Take 1 tablet by mouth once daily   ? losartan (COZAAR) 50 mg tablet TAKE ONE TABLET BY MOUTH DAILY **needs office visit**   ? methylPREDNIsolone (MEDROL DOSEPAK) 4 mg tablet Take one tablet by mouth daily.   ? mv-min/iron/folic/calcium/vitK (WOMEN'S MULTIVITAMIN PO) Take 1 capsule by mouth daily.   ? rizatriptan (MAXALT) 10 mg tablet Take one tablet by mouth daily as needed. Take one tablet by mouth at onset of headache. May repeat after 2 hours. Max of 30 mg in 24 hours. Do not take while taking Linezolid.   ? sertraline (ZOLOFT) 50 mg tablet Take one tablet by mouth daily. Hold while taking Linezolid   ? spironolactone (ALDACTONE) 25 mg tablet Take one tablet by mouth daily. Take with food.   ? traMADoL (ULTRAM) 50 mg tablet Take one tablet by mouth every 6 hours as needed for Pain. (Patient taking differently: Take one tablet by mouth daily.)

## 2022-01-13 ENCOUNTER — Encounter: Admit: 2022-01-13 | Discharge: 2022-01-13 | Payer: MEDICAID | Primary: Family

## 2022-01-13 DIAGNOSIS — G4736 Sleep related hypoventilation in conditions classified elsewhere: Secondary | ICD-10-CM

## 2022-01-13 NOTE — Progress Notes
Sleep study is abnormal. No sleep apnea but Nocturnal Hypoxemia noted.     Sleep study report should be uploaded in [facility] soon, please see the final report for details.

## 2022-01-17 ENCOUNTER — Encounter: Admit: 2022-01-17 | Discharge: 2022-01-17 | Payer: MEDICAID | Primary: Family

## 2022-01-17 NOTE — Telephone Encounter
-----   Message from Shella Maxim, MD sent at 01/17/2022 12:04 PM CDT -----  Let her know her home sleep study did not identify sleep apnea or significant low oxygen levels while she sleeps.  See how she has done with medication changes.. thanks  ----- Message -----  From: Janece Canterbury  Sent: 01/17/2022  11:38 AM CDT  To: Shella Maxim, MD

## 2022-01-25 ENCOUNTER — Encounter: Admit: 2022-01-25 | Discharge: 2022-01-25 | Payer: MEDICAID | Primary: Family

## 2022-01-27 ENCOUNTER — Encounter: Admit: 2022-01-27 | Discharge: 2022-01-27 | Payer: MEDICAID | Primary: Family

## 2022-01-27 MED ORDER — SCOPOLAMINE BASE 1 MG OVER 3 DAYS TD PT3D
MEDICATED_PATCH | 1 refills | Status: AC
Start: 2022-01-27 — End: ?

## 2022-01-31 ENCOUNTER — Encounter: Admit: 2022-01-31 | Discharge: 2022-01-31 | Payer: MEDICAID | Primary: Family

## 2022-01-31 ENCOUNTER — Ambulatory Visit: Admit: 2022-01-31 | Discharge: 2022-01-31 | Payer: MEDICAID | Primary: Family

## 2022-01-31 DIAGNOSIS — F17200 Nicotine dependence, unspecified, uncomplicated: Secondary | ICD-10-CM

## 2022-01-31 DIAGNOSIS — F845 Asperger's syndrome: Secondary | ICD-10-CM

## 2022-01-31 DIAGNOSIS — I2510 Atherosclerotic heart disease of native coronary artery without angina pectoris: Secondary | ICD-10-CM

## 2022-01-31 DIAGNOSIS — G43909 Migraine, unspecified, not intractable, without status migrainosus: Secondary | ICD-10-CM

## 2022-01-31 DIAGNOSIS — J449 Chronic obstructive pulmonary disease, unspecified: Secondary | ICD-10-CM

## 2022-01-31 DIAGNOSIS — M797 Fibromyalgia: Secondary | ICD-10-CM

## 2022-01-31 DIAGNOSIS — I5189 Other ill-defined heart diseases: Secondary | ICD-10-CM

## 2022-01-31 DIAGNOSIS — R110 Nausea: Secondary | ICD-10-CM

## 2022-01-31 DIAGNOSIS — J302 Other seasonal allergic rhinitis: Secondary | ICD-10-CM

## 2022-01-31 DIAGNOSIS — I272 Pulmonary hypertension, unspecified: Secondary | ICD-10-CM

## 2022-01-31 DIAGNOSIS — G709 Myoneural disorder, unspecified: Secondary | ICD-10-CM

## 2022-01-31 DIAGNOSIS — M549 Dorsalgia, unspecified: Secondary | ICD-10-CM

## 2022-01-31 DIAGNOSIS — T63301A Toxic effect of unspecified spider venom, accidental (unintentional), initial encounter: Secondary | ICD-10-CM

## 2022-01-31 DIAGNOSIS — M359 Systemic involvement of connective tissue, unspecified: Secondary | ICD-10-CM

## 2022-01-31 DIAGNOSIS — R0602 Shortness of breath: Secondary | ICD-10-CM

## 2022-01-31 DIAGNOSIS — J329 Chronic sinusitis, unspecified: Secondary | ICD-10-CM

## 2022-01-31 DIAGNOSIS — I10 Essential (primary) hypertension: Secondary | ICD-10-CM

## 2022-01-31 DIAGNOSIS — I519 Heart disease, unspecified: Secondary | ICD-10-CM

## 2022-01-31 DIAGNOSIS — C801 Malignant (primary) neoplasm, unspecified: Secondary | ICD-10-CM

## 2022-01-31 DIAGNOSIS — Z91018 Allergy to other foods: Secondary | ICD-10-CM

## 2022-01-31 DIAGNOSIS — F419 Anxiety disorder, unspecified: Secondary | ICD-10-CM

## 2022-01-31 NOTE — Telephone Encounter
Patient left VM requesting we send orders for PFT, Exercise Oximetry and ABG to [facility]  Fax # [phone].   Ph # [phone].    Routing to Alexe MA.

## 2022-01-31 NOTE — Assessment & Plan Note
Patient home sleep study demonstrated no significant sleep disordered breathing, however it shows mild oxygen desaturation with SPO2 less than 88% for 7 minutes.  Patient is active smoker she smoked 1-1/2 pack/day, she likely has underlying COPD.  We will order complete PFTs and exercise oximetry to evaluate for daytime oxygen need.  Her chest x-ray done at [facility] showed hyperinflation and lung mass, we will repeat checks x-ray at [place], based on cxr findings she will need follow up CT scan.    Discuss with patient the association of smoking with shortness of breath and she was instructed to stop smoking.    Until successful treatment is achieved, the patient should avoid driving and other activities that require sustained vigilance.
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
## 2022-02-01 ENCOUNTER — Encounter: Admit: 2022-02-01 | Discharge: 2022-02-01 | Payer: MEDICAID | Primary: Family

## 2022-02-01 DIAGNOSIS — R911 Solitary pulmonary nodule: Secondary | ICD-10-CM

## 2022-02-01 NOTE — Telephone Encounter
CT chest order has been faxed to [facility] along with insurance and demographic.       PFT, ex ox, and ABG order has been faxed to [facility] along with insurance, and demographic.     **Routing to [employer], RN     Valerie Salts, MA

## 2022-02-02 ENCOUNTER — Encounter: Admit: 2022-02-02 | Discharge: 2022-02-02 | Payer: MEDICAID | Primary: Family

## 2022-02-07 ENCOUNTER — Encounter: Admit: 2022-02-07 | Discharge: 2022-02-07 | Payer: MEDICAID | Primary: Family

## 2022-02-16 ENCOUNTER — Ambulatory Visit
Admit: 2022-02-16 | Discharge: 2022-02-16 | Payer: MEDICAID | Primary: Student in an Organized Health Care Education/Training Program

## 2022-02-16 ENCOUNTER — Encounter
Admit: 2022-02-16 | Discharge: 2022-02-16 | Payer: MEDICAID | Primary: Student in an Organized Health Care Education/Training Program

## 2022-02-16 DIAGNOSIS — J449 Chronic obstructive pulmonary disease, unspecified: Secondary | ICD-10-CM

## 2022-02-16 DIAGNOSIS — F17200 Nicotine dependence, unspecified, uncomplicated: Secondary | ICD-10-CM

## 2022-02-16 DIAGNOSIS — R0602 Shortness of breath: Secondary | ICD-10-CM

## 2022-02-16 DIAGNOSIS — I5189 Other ill-defined heart diseases: Secondary | ICD-10-CM

## 2022-02-16 LAB — BLOOD GASES, ARTERIAL
BASE DEFICIT-ART: 0.8 MMOL/L
PCO2-ART: 38 mmHg (ref 35–45)
PH-ART: 7.4 (ref 7.35–7.45)
PO2-ART: 75 mmHg — ABNORMAL LOW (ref <2.0)

## 2022-02-16 LAB — HEMOGLOBIN & HEMATOCRIT, BG
HEMATOCRIT BLOOD GAS: 47 % — ABNORMAL HIGH (ref 36–45)
HEMOGLOBIN BLOOD GAS: 15 g/dL — ABNORMAL HIGH (ref 12.0–15.0)

## 2022-02-16 LAB — METHEMOGLOBIN: METHEMOGLOBIN: 1 % (ref <1.5)

## 2022-02-16 NOTE — Progress Notes
Tech spoke with [name],RN in Pulmonary Clinic to report pt is experiencing dizziness daily and her feet are turning purple. Pt reports she has passed out several times this week. Bonnie James. RN stated that if patients feet are purple and she is dizzy, she is need to be evaluated in the ER. Tech advised patient to go to ER for evaluation.

## 2022-02-21 ENCOUNTER — Encounter
Admit: 2022-02-21 | Discharge: 2022-02-21 | Payer: MEDICAID | Primary: Student in an Organized Health Care Education/Training Program

## 2022-02-21 DIAGNOSIS — G4734 Idiopathic sleep related nonobstructive alveolar hypoventilation: Secondary | ICD-10-CM

## 2022-02-21 NOTE — Progress Notes
I called patient to discuss recent result. We will order 2 L supplemental oxygen at night.  Patient was also notified regarding the elevated carbon monoxide level level on recent blood test.  Recommend to check carbon oxide levels at home.  Patient has questions regarding weakness, I recommend patient to follow-up with PCP for referral for rheumatology or neurology for further work-up.  Patient instructed to follow-up with pulmonary clinic within 3 months.      Bonnie Cheadle, MD

## 2022-02-22 ENCOUNTER — Encounter
Admit: 2022-02-22 | Discharge: 2022-02-22 | Payer: MEDICAID | Primary: Student in an Organized Health Care Education/Training Program

## 2022-02-24 ENCOUNTER — Encounter
Admit: 2022-02-24 | Discharge: 2022-02-24 | Payer: MEDICAID | Primary: Student in an Organized Health Care Education/Training Program

## 2022-02-24 NOTE — Telephone Encounter
Order for oxygen placed per recommendations.   DME: [facility] Kathe Mariner  Will continue to follow up.

## 2022-02-28 ENCOUNTER — Encounter
Admit: 2022-02-28 | Discharge: 2022-02-28 | Payer: MEDICAID | Primary: Student in an Organized Health Care Education/Training Program

## 2022-03-02 ENCOUNTER — Encounter
Admit: 2022-03-02 | Discharge: 2022-03-02 | Payer: MEDICAID | Primary: Student in an Organized Health Care Education/Training Program

## 2022-03-02 ENCOUNTER — Ambulatory Visit
Admit: 2022-03-02 | Discharge: 2022-03-02 | Payer: MEDICAID | Primary: Student in an Organized Health Care Education/Training Program

## 2022-03-02 DIAGNOSIS — R079 Chest pain, unspecified: Secondary | ICD-10-CM

## 2022-03-02 DIAGNOSIS — R0602 Shortness of breath: Secondary | ICD-10-CM

## 2022-03-07 ENCOUNTER — Encounter
Admit: 2022-03-07 | Discharge: 2022-03-07 | Payer: MEDICAID | Primary: Student in an Organized Health Care Education/Training Program

## 2022-03-07 ENCOUNTER — Ambulatory Visit
Admit: 2022-03-07 | Discharge: 2022-03-07 | Payer: MEDICAID | Primary: Student in an Organized Health Care Education/Training Program

## 2022-03-07 DIAGNOSIS — R079 Chest pain, unspecified: Secondary | ICD-10-CM

## 2022-03-07 DIAGNOSIS — I509 Heart failure, unspecified: Secondary | ICD-10-CM

## 2022-03-08 ENCOUNTER — Encounter
Admit: 2022-03-08 | Discharge: 2022-03-08 | Payer: MEDICAID | Primary: Student in an Organized Health Care Education/Training Program

## 2022-03-10 ENCOUNTER — Encounter
Admit: 2022-03-10 | Discharge: 2022-03-10 | Payer: MEDICAID | Primary: Student in an Organized Health Care Education/Training Program

## 2022-03-28 ENCOUNTER — Encounter
Admit: 2022-03-28 | Discharge: 2022-03-28 | Payer: MEDICAID | Primary: Student in an Organized Health Care Education/Training Program

## 2022-03-28 NOTE — Telephone Encounter
Order for noc ox on RA placed per recommendations.   DME: [facility]  Will continue to follow up.

## 2022-04-07 ENCOUNTER — Encounter
Admit: 2022-04-07 | Discharge: 2022-04-07 | Payer: MEDICAID | Primary: Student in an Organized Health Care Education/Training Program

## 2022-04-10 ENCOUNTER — Encounter
Admit: 2022-04-10 | Discharge: 2022-04-10 | Payer: MEDICAID | Primary: Student in an Organized Health Care Education/Training Program

## 2022-04-26 ENCOUNTER — Encounter
Admit: 2022-04-26 | Discharge: 2022-04-26 | Payer: MEDICAID | Primary: Student in an Organized Health Care Education/Training Program

## 2022-04-27 ENCOUNTER — Encounter
Admit: 2022-04-27 | Discharge: 2022-04-27 | Payer: MEDICAID | Primary: Student in an Organized Health Care Education/Training Program

## 2022-04-27 NOTE — Telephone Encounter
Order for oxygen and noc ox on O2 of 2lpm placed per recommendations.   DME: [facility]  Will continue to follow up.

## 2022-05-01 ENCOUNTER — Encounter
Admit: 2022-05-01 | Discharge: 2022-05-01 | Payer: MEDICAID | Primary: Student in an Organized Health Care Education/Training Program

## 2022-05-01 NOTE — Telephone Encounter
-----   Message from Tonye Pearson, RN sent at 05/01/2022  9:25 AM CDT -----    ----- Message -----  From: Dannielle Huh, MD  Sent: 04/28/2022   3:32 PM CDT  To: Tonye Pearson, RN    I reviewed her nocturnal oximetry with her from June 14th which showed 36 minutes below 88%.  She is agreeable to oxygen at 2 liters during sleep so will send an order to [facility] in [place].  She will see the sleep fellow back in next 1-2 months for follow up to review her PFTs, exercise oximetry and other testing.

## 2022-05-04 ENCOUNTER — Encounter
Admit: 2022-05-04 | Discharge: 2022-05-04 | Payer: MEDICAID | Primary: Student in an Organized Health Care Education/Training Program

## 2022-05-04 NOTE — Telephone Encounter
Requested: Insurance card    DME: [facility]    Faxed: [phone]

## 2022-05-09 ENCOUNTER — Encounter
Admit: 2022-05-09 | Discharge: 2022-05-09 | Payer: MEDICAID | Primary: Student in an Organized Health Care Education/Training Program

## 2022-05-12 ENCOUNTER — Encounter
Admit: 2022-05-12 | Discharge: 2022-05-12 | Payer: MEDICAID | Primary: Student in an Organized Health Care Education/Training Program

## 2022-05-17 ENCOUNTER — Encounter
Admit: 2022-05-17 | Discharge: 2022-05-17 | Payer: MEDICAID | Primary: Student in an Organized Health Care Education/Training Program

## 2022-05-18 ENCOUNTER — Encounter
Admit: 2022-05-18 | Discharge: 2022-05-18 | Payer: MEDICAID | Primary: Student in an Organized Health Care Education/Training Program

## 2022-05-23 ENCOUNTER — Encounter
Admit: 2022-05-23 | Discharge: 2022-05-23 | Payer: MEDICAID | Primary: Student in an Organized Health Care Education/Training Program

## 2022-05-23 ENCOUNTER — Ambulatory Visit
Admit: 2022-05-23 | Discharge: 2022-05-24 | Payer: MEDICAID | Primary: Student in an Organized Health Care Education/Training Program

## 2022-05-23 DIAGNOSIS — M359 Systemic involvement of connective tissue, unspecified: Secondary | ICD-10-CM

## 2022-05-23 DIAGNOSIS — J302 Other seasonal allergic rhinitis: Secondary | ICD-10-CM

## 2022-05-23 DIAGNOSIS — M797 Fibromyalgia: Secondary | ICD-10-CM

## 2022-05-23 DIAGNOSIS — I519 Heart disease, unspecified: Secondary | ICD-10-CM

## 2022-05-23 DIAGNOSIS — I2510 Atherosclerotic heart disease of native coronary artery without angina pectoris: Secondary | ICD-10-CM

## 2022-05-23 DIAGNOSIS — T63301A Toxic effect of unspecified spider venom, accidental (unintentional), initial encounter: Secondary | ICD-10-CM

## 2022-05-23 DIAGNOSIS — F419 Anxiety disorder, unspecified: Secondary | ICD-10-CM

## 2022-05-23 DIAGNOSIS — J329 Chronic sinusitis, unspecified: Secondary | ICD-10-CM

## 2022-05-23 DIAGNOSIS — Z91018 Allergy to other foods: Secondary | ICD-10-CM

## 2022-05-23 DIAGNOSIS — G43909 Migraine, unspecified, not intractable, without status migrainosus: Secondary | ICD-10-CM

## 2022-05-23 DIAGNOSIS — I272 Pulmonary hypertension, unspecified: Secondary | ICD-10-CM

## 2022-05-23 DIAGNOSIS — G709 Myoneural disorder, unspecified: Secondary | ICD-10-CM

## 2022-05-23 DIAGNOSIS — M549 Dorsalgia, unspecified: Secondary | ICD-10-CM

## 2022-05-23 DIAGNOSIS — F845 Asperger's syndrome: Secondary | ICD-10-CM

## 2022-05-23 DIAGNOSIS — R110 Nausea: Secondary | ICD-10-CM

## 2022-05-23 DIAGNOSIS — J449 Chronic obstructive pulmonary disease, unspecified: Secondary | ICD-10-CM

## 2022-05-23 DIAGNOSIS — I10 Essential (primary) hypertension: Secondary | ICD-10-CM

## 2022-05-23 DIAGNOSIS — C801 Malignant (primary) neoplasm, unspecified: Secondary | ICD-10-CM

## 2022-05-23 DIAGNOSIS — G479 Sleep disorder, unspecified: Secondary | ICD-10-CM

## 2022-05-23 DIAGNOSIS — U099 COVID-19 long hauler: Secondary | ICD-10-CM

## 2022-05-23 DIAGNOSIS — R0602 Shortness of breath: Secondary | ICD-10-CM

## 2022-05-23 NOTE — Progress Notes
Date of Service: 05/23/2022    Bonnie James is a 46 y.o. female.       HPI     Patient is a 45 year old Caucasian female past medical history of variable palpitations, lightheadedness, and swelling and discomfort in her arms and legs.  Has had extensive evaluations from multiple specialties with borderline results as far as diagnoses and treatments.  She had COVID-19  01/2019.  Her symptoms have been more pronounced after this event and is considered to have COVID.  When I saw the patient did discuss she has significant posttraumatic stress disorder from previous relationship events.  Also has a lot of ongoing stressors in her family and personal life that make it difficult for her to sleep.  She has had intolerance to various heart rate and blood pressure medicines in the past.  Have placed her on the prazosin which she is tolerated very well.  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  Is currently been using the nighttime O2 and reports she overall is doing dramatically better.  Her body aches and pains are decreased and less frequent and severe.  Is more rested during the day.  Has cut back on her smoking to less than half a pack per day.  Still uses regular inhalers and does have wheezing at times.  Recently has undergone testing for myasthenia's gravis which has been nonspecific in results.  Has seen neurology in the past without specific diagnosis.  Does have plans to be seen by a rheumatologist to further assess some of the symptoms in her arms and legs.         Vitals:    05/23/22 0829   BP: 118/86   BP Source: Arm, Left Upper   Pulse: 111   SpO2: 92%   O2 Device: None (Room air)   PainSc: Zero   Weight: 99.9 kg (220 lb 3.2 oz)   Height: 167.6 cm (5' 6)     Body mass index is 35.54 kg/m?Marland Kitchen     Past Medical History  Patient Active Problem List    Diagnosis Date Noted   ? Shortness of breath 01/31/2022     HST: 01/01/2022 AHI: 0.9, AHI 0.9, Mean 91%, lowest 84% SpO2 for 7 mins     ? Cellulitis of left lower extremity 02/02/2020   ? Edema of left lower extremity 01/30/2020   ? Closed displaced fracture of fifth metatarsal bone of left foot 09/10/2019   ? Suspected COVID-19 virus infection 01/30/2019   ? Chest pain 10/07/2018   ? Palpitations 10/07/2018     11-07-2018 Holter Monitor   A 48-hour Holter monitor demonstrates sinus at 68-165 bpm with a mean of 106 bpm. The average rate is somewhat fast. There are 1295 isolated APCs which is 0.4% of all beats. There are 36 atrial couplets. There are 18 runs of atrial tachycardia for a total of 72 beats (average 4 beats per run). The longest run is 5 beats. There is no A. fib or a flutter. There is a single isolated PVC with no couplets and no runs. AV conduction is normal. There are no pauses. There are no diary entries or patient triggers     ? 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 Normal LV size.  Borderline systolic function with EF about 45 to 50%  2. Unable to assess diastolic function  3. Normal right ventricular size and systolic  function  4. Normal left atrial size  5. No significant functional valvular abnormalities  6. Normal central venous pressure  7. Unable to assess pulmonary artery systolic pressure due to inadequate TR jet  8. Only the very beginning of the ascending aortic root was seen up to the sinotubular junction and is normal size.  Further distally the ascending aorta was not seen in the study and cannot be commented on  9. No pericardial effusion  ?  09-01-2019 ECHO D:   ? Left Ventricle: The left ventricular size is normal. Concentric remodeling. Poor LV endocardial border visualization, systolic function appears to be mildly to moderately reduced, visually EF 40-45%.Mild global hypokinesis slightly more prominent in the septum. No major changes compared to prior study dated 09/26/2018. Grade I (mild) left ventricular diastolic dysfunction.  ? Right Ventricle: The right ventricular size is normal. The right ventricular systolic function is normal.  ? No hemodynamically significant valvular abnormalities.  ? Normal central venous pressure (0-5 mm Hg).     ? Migraine with aura and without status migrainosus, not intractable 09/24/2017   ? Intractable chronic migraine without aura and without status migrainosus 09/24/2017   ? Essential hypertension 09/24/2017     Regadenoson MPI Stress Test - 09/26/18 at [facility] - This myocardial post imaging study is abnormal due to decreased left ventricular systolic function and also possible EKG changes.  The tomographic pattern was negative for ischemia, there are no fixed or reversible perfusion abnormality.  Significant anteroseptal wall reversible distribution was present due to severe breast artifact.  True ischemia did not appear to be present on the study.  The tomographic images also revealed significant diaphragmatic and bowel artifact.  Mildly depressed left ventricular systolic function, ejection fraction 44%, global hypokinesis. In regards to patient's resting and stress EKG, although difficult to determine on the EKG is available for review, the QTc was borderline elevated at rest (491 ms), and a heart rate of 82 bpm, with exercise it was approximately 537 ms at a heart rate of 103 bpm.  Patient should undergo further evaluation of these findings     ? Spondylosis of cervical region without myelopathy or radiculopathy 09/24/2017         Review of Systems   Constitutional: Negative.   HENT: Negative.    Eyes: Negative.    Cardiovascular: Positive for palpitations.   Respiratory: Negative.    Endocrine: Negative.    Hematologic/Lymphatic: Negative.    Skin: Negative.    Musculoskeletal: Negative.    Gastrointestinal: Negative.    Genitourinary: Negative.    Neurological: Negative.    Psychiatric/Behavioral: Negative.    Allergic/Immunologic: Negative.        Physical Exam  Awake and alert, somewhat sleepy but no distress. Appears given age and well-nourished  Pupils are equal round without scleral injection  Neck is supple no carotid upstroke, bruits, masses or jugular venous abnormalities  Chest is symmetric with bilateral expiratory wheezing with no focal crackles or cough provoked  Heart S1, S2 is normal.  No murmurs, gallops  M is mildly obese but soft  Pulse are 2+, regular, and symmetric bilaterally at radial as well as pedal locations  No significant peripheral edema with good muscle tone and symmetric skin turgor    Cardiovascular Studies      Cardiovascular Health Factors  Vitals BP Readings from Last 3 Encounters:   05/23/22 118/86   03/07/22 127/86   01/31/22 (!) 150/100     Wt Readings from Last  3 Encounters:   05/23/22 99.9 kg (220 lb 3.2 oz)   03/07/22 106 kg (233 lb 11 oz)   01/31/22 105.2 kg (232 lb)     BMI Readings from Last 3 Encounters:   05/23/22 35.54 kg/m?   03/07/22 37.72 kg/m?   01/31/22 37.45 kg/m?      Smoking Social History     Tobacco Use   Smoking Status Every Day   ? Packs/day: 0.50   ? Years: 10.00   ? Pack years: 5.00   ? Types: Cigarettes   Smokeless Tobacco Never   Tobacco Comments    Working on quitting.   Vaping Use   ? Vaping Use: Not on file      Lipid Profile Cholesterol   Date Value Ref Range Status   01/06/2021 222 (H) <200 Final     HDL   Date Value Ref Range Status   01/06/2021 43  Final     LDL   Date Value Ref Range Status   01/06/2021 128 (H) <100 Final     Triglycerides   Date Value Ref Range Status   01/06/2021 257 (H) <150 Final      Blood Sugar Hemoglobin A1C   Date Value Ref Range Status   01/30/2020 5.4 4.0 - 6.0 % Final     Comment:     The ADA recommends that most patients with type 1 and type 2 diabetes maintain   an A1c level <7%.       Glucose   Date Value Ref Range Status   01/30/2022 93  Final   02/10/2021 96 70 - 100 MG/DL Final   16/07/9603 93 70 - 100 MG/DL Final          Problems Addressed Today  Encounter Diagnoses   Name Primary?   ? 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 Shortness of breath    ? Sleep disorder    ? Chronic obstructive pulmonary disease, unspecified COPD type (HCC)        Assessment and Plan     Heart and vascular findings are clinically stable.  Blood pressures management is better controlled with the present she is tolerating well.  Still struggles with sleep but overall is improved with treatment of the nocturnal hypoxia.  I suspect that her borderline to mild elevated hemoglobin may be driven by this chronic hypoxia state.  She has had phlebotomy for treatment for her increased hemoglobin with some improvement of her skin muscle symptoms.  At this time would not advise further testing from a cardiovascular standpoint.  Continue current medication and follow-up can be in approximately 6 to 12 months unless there is a change in her cardiovascular symptoms.         Current Medications (including today's revisions)  ? acetaminophen (TYLENOL) 325 mg tablet Take one tablet by mouth every 4 hours as needed for Pain.   ? albuterol 0.5% (PROVENTIL; VENTOLIN) 2.5 mg/0.5 mL nebulizer solution Inhale one each solution by nebulizer as directed every 6 hours as needed for Shortness of Breath or Wheezing.   ? albuterol sulfate (PROAIR HFA) 90 mcg/actuation aerosol inhaler Inhale one puff by mouth into the lungs every 4 hours. Shake well before use.   ? ALPRAZolam (XANAX) 0.5 mg tablet Take one tablet by mouth as Needed.   ? amLODIPine (NORVASC) 10 mg tablet Take one-half tablet by mouth daily.   ? baclofen (LIORESAL) 20 mg tablet Take one tablet by mouth at bedtime daily.   ?  benzonatate (TESSALON PERLES) 100 mg capsule Take one capsule by mouth three times daily as needed for Cough.   ? brexpiprazole (REXULTI) 2 mg tablet Take one tablet by mouth daily.   ? cephalexin (KEFLEX) 500 mg capsule Take one capsule by mouth three times daily.   ? cetirizine (ZYRTEC) 10 mg tablet Take one tablet by mouth daily.   ? coenzyme Q10 (CO Q-10) 100 mg capsule Take one capsule by mouth daily. ? dextroamphetamine-amphetamine (ADDERALL) 5 mg tablet Take one tablet by mouth daily.   ? diphenhydrAMINE (BENADRYL) 25 mg capsule Take one capsule by mouth every 6 hours.   ? EPINEPHrine (EPIPEN) 1 mg/mL injection pen (2-Pack) Inject 0.3 mL into the muscle once as needed. Inject 0.3 mg (1 Pen) into thigh if needed for anaphylactic reaction. May repeat in 5-15 minutes if needed.   ? flaxseed oil (OMEGA 3 PO) Take  by mouth.   ? fluticasone (FLONASE) 50 mcg/actuation nasal spray Apply  to each nostril as directed daily. Shake bottle gently before using.   ? fluticasone/umeclidin/vilanter (TRELEGY ELLIPTA IN) Inhale  by mouth into the lungs.   ? furosemide (LASIX) 20 mg tablet Take one tablet by mouth daily.   ? hydrOXYzine pamoate (VISTARIL) 25 mg capsule Every 4-6 Hours As Needed as needed for For Allergies   ? lisdexamfetamine (VYVANSE) 60 mg capsule Take one capsule by mouth every 24 hours.   ? methylPREDNIsolone (MEDROL DOSEPAK) 4 mg tablet Take one tablet by mouth daily.   ? MULTIVITAMIN PO Take  by mouth.   ? mv-min/iron/folic/calcium/vitK (WOMEN'S MULTIVITAMIN PO) Take 1 capsule by mouth daily.   ? prazosin (MINIPRESS) 1 mg capsule Take one capsule by mouth at bedtime daily.   ? rizatriptan (MAXALT) 10 mg tablet Take one tablet by mouth daily as needed. Take one tablet by mouth at onset of headache. May repeat after 2 hours. Max of 30 mg in 24 hours. Do not take while taking Linezolid.   ? scopolamine (TRANSDERM-SCOP) 1mg  over 3 days 3 day patch apply on patch topically AS DIRECTED EVERY 72 HOURS   ? sertraline (ZOLOFT) 50 mg tablet Take one tablet by mouth daily. Hold while taking Linezolid   ? spironolactone (ALDACTONE) 25 mg tablet Take one tablet by mouth daily. Take with food.   ? traMADoL (ULTRAM) 50 mg tablet Take one tablet by mouth every 6 hours as needed for Pain. (Patient taking differently: Take one tablet by mouth daily.)

## 2022-06-27 ENCOUNTER — Encounter
Admit: 2022-06-27 | Discharge: 2022-06-27 | Payer: MEDICAID | Primary: Student in an Organized Health Care Education/Training Program

## 2022-07-25 ENCOUNTER — Encounter
Admit: 2022-07-25 | Discharge: 2022-07-25 | Payer: MEDICAID | Primary: Student in an Organized Health Care Education/Training Program

## 2022-07-25 ENCOUNTER — Ambulatory Visit
Admit: 2022-07-25 | Discharge: 2022-07-25 | Payer: MEDICAID | Primary: Student in an Organized Health Care Education/Training Program

## 2022-07-25 DIAGNOSIS — F419 Anxiety disorder, unspecified: Secondary | ICD-10-CM

## 2022-07-25 DIAGNOSIS — J329 Chronic sinusitis, unspecified: Secondary | ICD-10-CM

## 2022-07-25 DIAGNOSIS — I519 Heart disease, unspecified: Secondary | ICD-10-CM

## 2022-07-25 DIAGNOSIS — Z91018 Allergy to other foods: Secondary | ICD-10-CM

## 2022-07-25 DIAGNOSIS — R110 Nausea: Secondary | ICD-10-CM

## 2022-07-25 DIAGNOSIS — G4734 Idiopathic sleep related nonobstructive alveolar hypoventilation: Secondary | ICD-10-CM

## 2022-07-25 DIAGNOSIS — I272 Pulmonary hypertension, unspecified: Secondary | ICD-10-CM

## 2022-07-25 DIAGNOSIS — M797 Fibromyalgia: Secondary | ICD-10-CM

## 2022-07-25 DIAGNOSIS — J302 Other seasonal allergic rhinitis: Secondary | ICD-10-CM

## 2022-07-25 DIAGNOSIS — I10 Essential (primary) hypertension: Secondary | ICD-10-CM

## 2022-07-25 DIAGNOSIS — C801 Malignant (primary) neoplasm, unspecified: Secondary | ICD-10-CM

## 2022-07-25 DIAGNOSIS — I2510 Atherosclerotic heart disease of native coronary artery without angina pectoris: Secondary | ICD-10-CM

## 2022-07-25 DIAGNOSIS — G43909 Migraine, unspecified, not intractable, without status migrainosus: Secondary | ICD-10-CM

## 2022-07-25 DIAGNOSIS — M359 Systemic involvement of connective tissue, unspecified: Secondary | ICD-10-CM

## 2022-07-25 DIAGNOSIS — F845 Asperger's syndrome: Secondary | ICD-10-CM

## 2022-07-25 DIAGNOSIS — M549 Dorsalgia, unspecified: Secondary | ICD-10-CM

## 2022-07-25 DIAGNOSIS — T63301A Toxic effect of unspecified spider venom, accidental (unintentional), initial encounter: Secondary | ICD-10-CM

## 2022-07-25 DIAGNOSIS — G709 Myoneural disorder, unspecified: Secondary | ICD-10-CM

## 2022-07-25 NOTE — Progress Notes
Subjective:       History of Present Illness  Bonnie James is a 46 y.o. female.       Review of Systems      Objective:         ? acetaminophen (TYLENOL) 325 mg tablet Take one tablet by mouth every 4 hours as needed for Pain.   ? albuterol 0.5% (PROVENTIL; VENTOLIN) 2.5 mg/0.5 mL nebulizer solution Inhale one each solution by nebulizer as directed every 6 hours as needed for Shortness of Breath or Wheezing.   ? albuterol sulfate (PROAIR HFA) 90 mcg/actuation aerosol inhaler Inhale one puff by mouth into the lungs every 4 hours. Shake well before use.   ? ALPRAZolam (XANAX) 0.5 mg tablet Take one tablet by mouth as Needed.   ? amLODIPine (NORVASC) 10 mg tablet Take one-half tablet by mouth daily.   ? amLODIPine (NORVASC) 5 mg tablet Take one tablet by mouth daily.   ? baclofen (LIORESAL) 20 mg tablet Take one tablet by mouth at bedtime daily.   ? benzonatate (TESSALON PERLES) 100 mg capsule Take one capsule by mouth three times daily as needed for Cough.   ? brexpiprazole (REXULTI) 2 mg tablet Take one tablet by mouth daily.   ? cephalexin (KEFLEX) 500 mg capsule Take one capsule by mouth three times daily.   ? cetirizine (ZYRTEC) 10 mg tablet Take one tablet by mouth daily.   ? coenzyme Q10 (CO Q-10) 100 mg capsule Take one capsule by mouth daily.   ? dextroamphetamine-amphetamine (ADDERALL) 5 mg tablet Take one tablet by mouth daily.   ? diphenhydrAMINE (BENADRYL) 25 mg capsule Take one capsule by mouth every 6 hours.   ? EPINEPHrine (EPIPEN) 1 mg/mL injection pen (2-Pack) Inject 0.3 mL into the muscle once as needed. Inject 0.3 mg (1 Pen) into thigh if needed for anaphylactic reaction. May repeat in 5-15 minutes if needed.   ? flaxseed oil (OMEGA 3 PO) Take  by mouth.   ? fluticasone (FLONASE) 50 mcg/actuation nasal spray Apply  to each nostril as directed daily. Shake bottle gently before using.   ? fluticasone/umeclidin/vilanter (TRELEGY ELLIPTA IN) Inhale  by mouth into the lungs.   ? furosemide (LASIX) 20 mg tablet Take one tablet by mouth daily.   ? hydrOXYzine pamoate (VISTARIL) 25 mg capsule Every 4-6 Hours As Needed as needed for For Allergies   ? lisdexamfetamine (VYVANSE) 60 mg capsule Take one capsule by mouth every 24 hours.   ? methylPREDNIsolone (MEDROL DOSEPAK) 4 mg tablet Take one tablet by mouth daily.   ? MULTIVITAMIN PO Take  by mouth.   ? mv-min/iron/folic/calcium/vitK (WOMEN'S MULTIVITAMIN PO) Take 1 capsule by mouth daily.   ? prazosin (MINIPRESS) 1 mg capsule Take one capsule by mouth at bedtime daily.   ? rizatriptan (MAXALT) 10 mg tablet Take one tablet by mouth daily as needed. Take one tablet by mouth at onset of headache. May repeat after 2 hours. Max of 30 mg in 24 hours. Do not take while taking Linezolid.   ? scopolamine (TRANSDERM-SCOP) 1mg  over 3 days 3 day patch apply on patch topically AS DIRECTED EVERY 72 HOURS   ? sertraline (ZOLOFT) 100 mg tablet Take one tablet by mouth daily.   ? sertraline (ZOLOFT) 50 mg tablet Take one tablet by mouth daily. Hold while taking Linezolid   ? spironolactone (ALDACTONE) 25 mg tablet Take one tablet by mouth daily. Take with food.   ? traMADoL (ULTRAM) 50 mg tablet Take one tablet  by mouth every 6 hours as needed for Pain. (Patient taking differently: Take one tablet by mouth daily.)     Vitals:    07/25/22 1407   BP: (!) 140/100   BP Source: Arm, Left Upper   Pulse: (!) 132   Resp: 20   SpO2: 95%   Weight: 95.4 kg (210 lb 4.8 oz)     Body mass index is 33.94 kg/m?Marland Kitchen     Physical Exam                 Assessment and Plan:    Bonnie James is a 46 y.o. female.

## 2022-07-27 ENCOUNTER — Encounter
Admit: 2022-07-27 | Discharge: 2022-07-27 | Payer: MEDICAID | Primary: Student in an Organized Health Care Education/Training Program

## 2022-07-27 NOTE — Telephone Encounter
DME order for ONO faxed to [facility] via [employer].     Routed to [place], [place] for DME follow up.

## 2022-07-28 ENCOUNTER — Encounter
Admit: 2022-07-28 | Discharge: 2022-07-28 | Payer: MEDICAID | Primary: Student in an Organized Health Care Education/Training Program

## 2022-07-28 NOTE — Telephone Encounter
Outgoing call to offer an appointment with Dr Gerilyn Nestle on 09/11/22 at 9 am. No answer. Left message.

## 2022-08-14 ENCOUNTER — Encounter
Admit: 2022-08-14 | Discharge: 2022-08-14 | Payer: MEDICAID | Primary: Student in an Organized Health Care Education/Training Program

## 2022-08-28 ENCOUNTER — Encounter
Admit: 2022-08-28 | Discharge: 2022-08-28 | Payer: MEDICAID | Primary: Student in an Organized Health Care Education/Training Program

## 2022-09-07 ENCOUNTER — Encounter
Admit: 2022-09-07 | Discharge: 2022-09-07 | Payer: MEDICAID | Primary: Student in an Organized Health Care Education/Training Program

## 2022-09-28 ENCOUNTER — Encounter
Admit: 2022-09-28 | Discharge: 2022-09-28 | Payer: MEDICAID | Primary: Student in an Organized Health Care Education/Training Program

## 2022-10-20 ENCOUNTER — Encounter
Admit: 2022-10-20 | Discharge: 2022-10-20 | Payer: MEDICAID | Primary: Student in an Organized Health Care Education/Training Program

## 2022-11-07 IMAGING — MG MAMMOGRAM 3D DX, LEFT
4 series · 8 of 8 positions shown · non-contrast
Comparison: none

[Series 4: L tomo · axial · left · 1.0mm · 0.09mm/px · z∈[-23,+47]mm · 3 of 69 slices shown (1 of 2)]
[im 1/69]
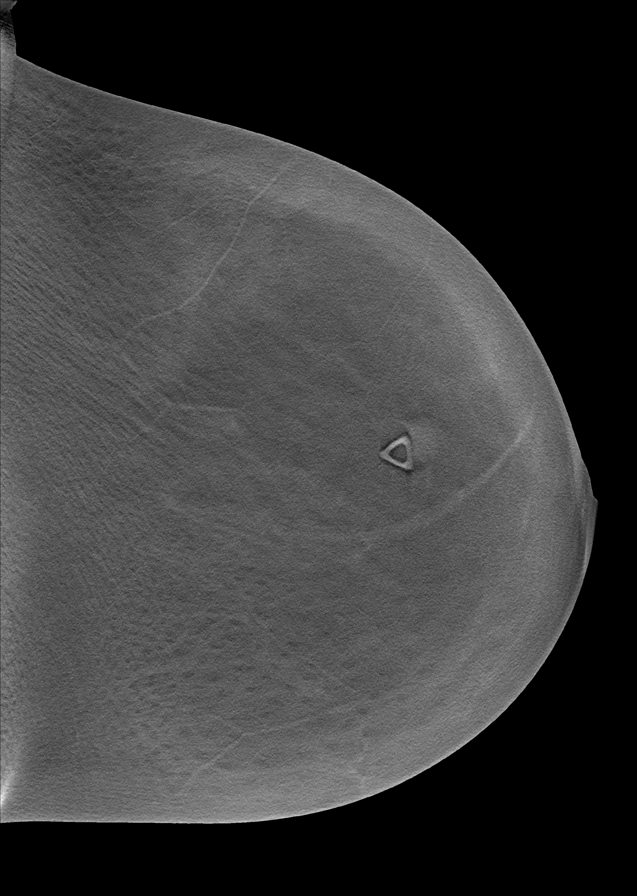
[im 35/69]
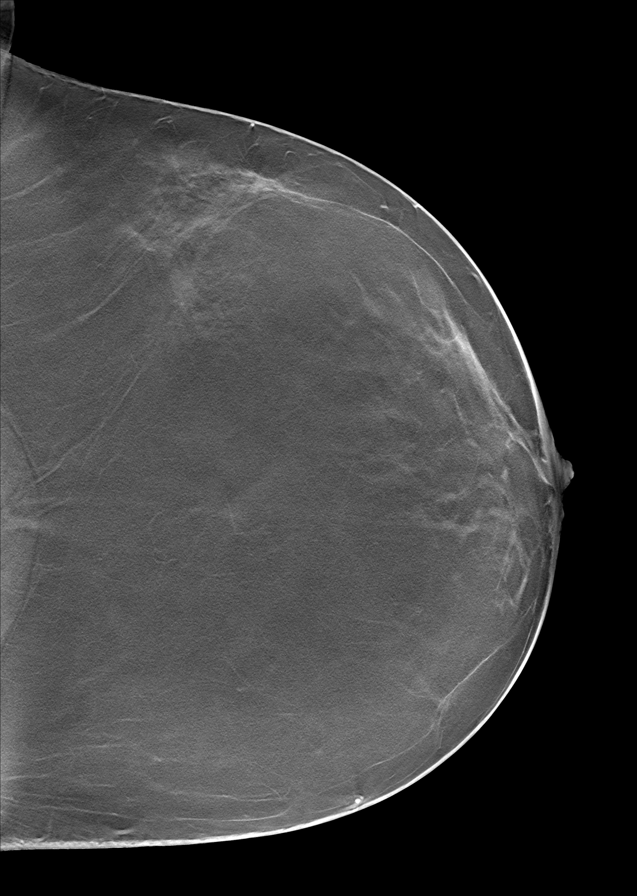
[im 69/69]
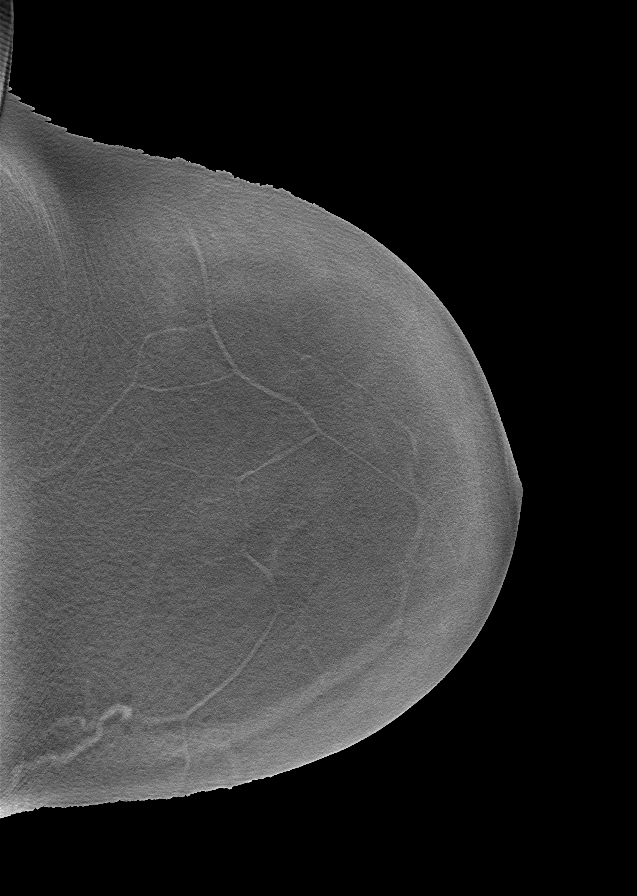

[L MLO]
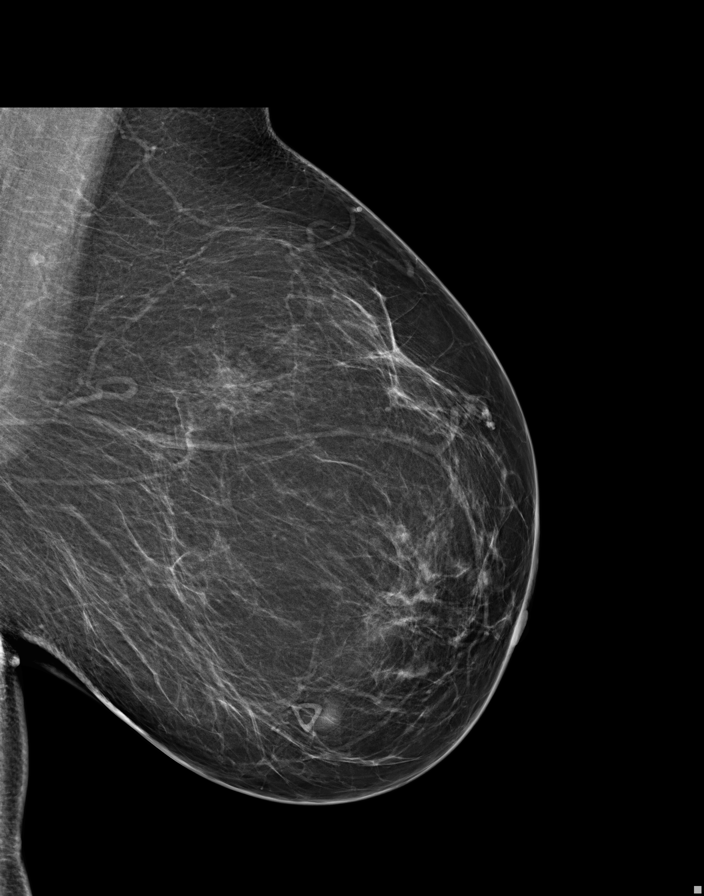

[Series 7: L tomo · oblique · left · 1.0mm · 0.09mm/px · 3 of 81 slices shown (2 of 2)]
[im 1/81]
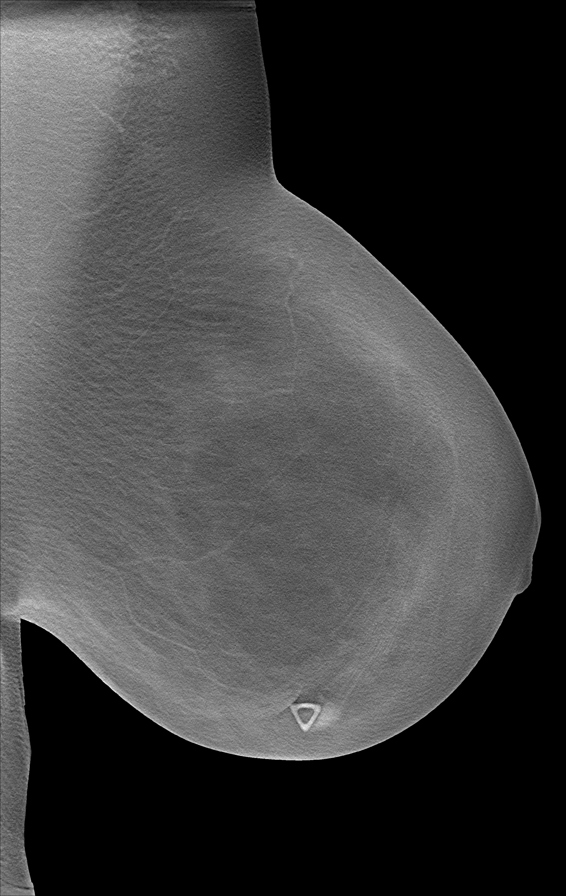
[im 41/81]
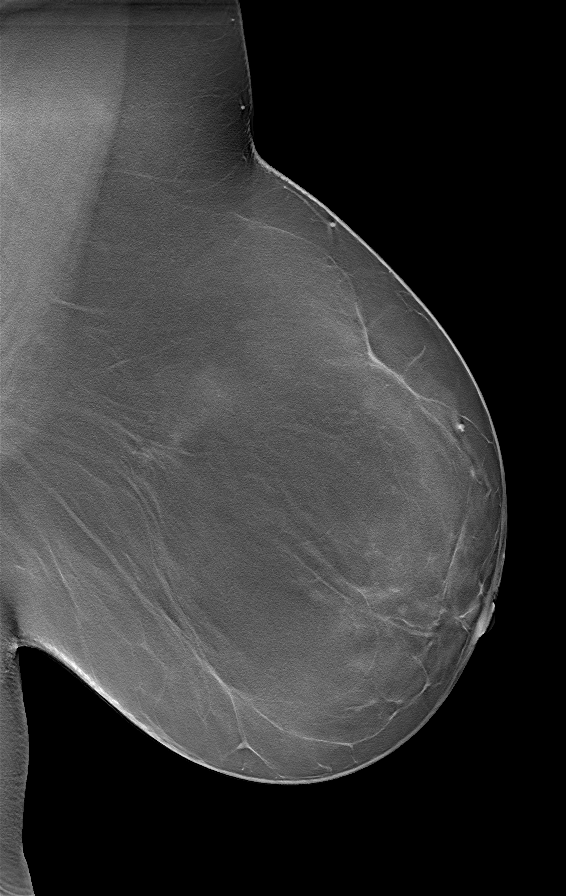
[im 81/81]
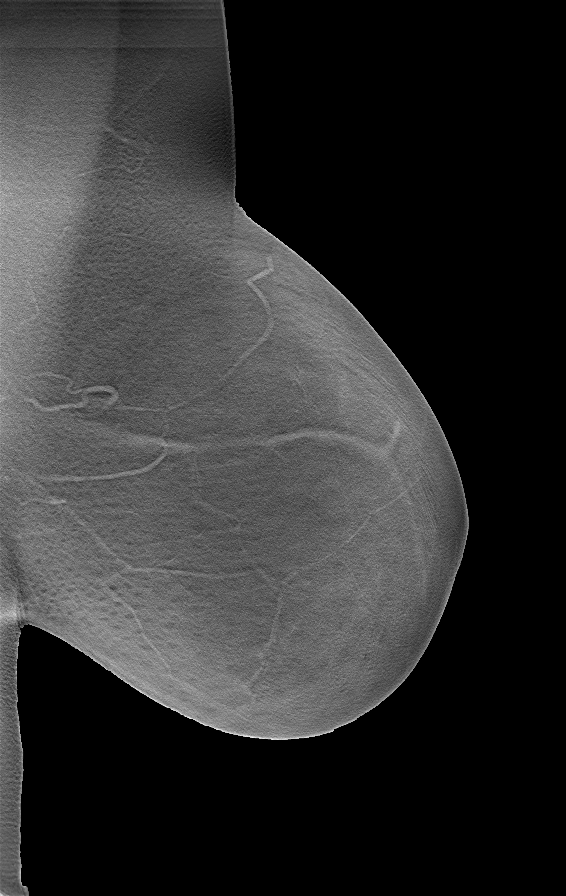

[L ML]
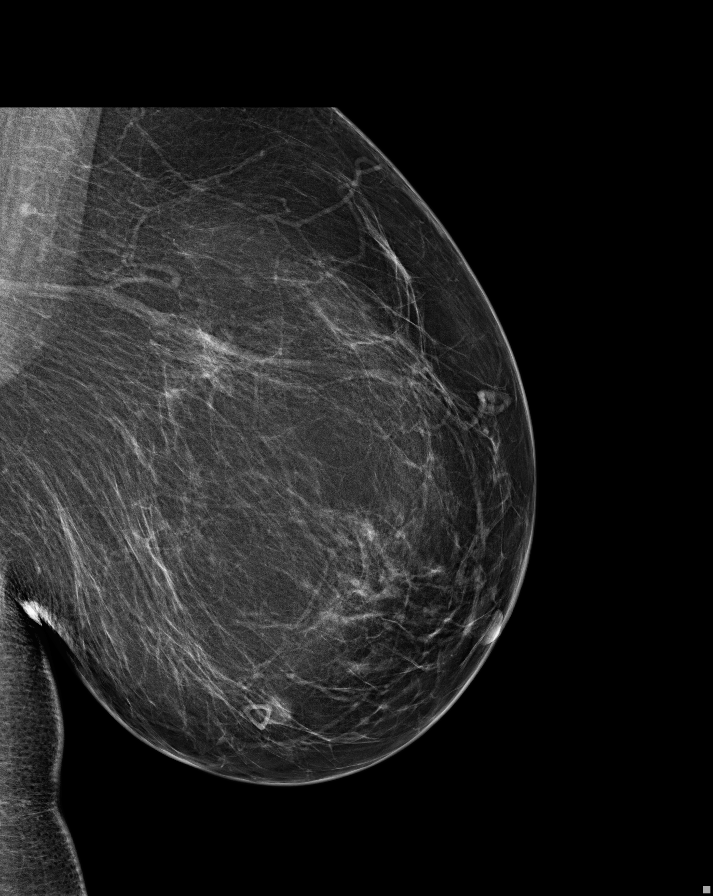

[8 of 8 positions shown; findings below may reference images not displayed]

EXAM

MM mammogram 3D dx LT

INDICATION

lump in breast
lt breast lump for the last 2 months, left breast pain for the last month, feels like the lump has
gotten larger than when pt was here last time.  kf. priors [id number]

TECHNIQUE

2D and tomosynthesis digital craniocaudal and mediolateral oblique views were obtained of the left
breast. Computer aided detection software was utilized.

COMPARISONS

12/14/20

FINDINGS

UNILATERAL MAMMOGRAM: Scattered fibroglandular tissue density.  No suspicious microcalcification,
architectural distortion, or spiculated mass. Small oval-shaped density in the area of palpable
concern likely within the skin.

TARGETED ULTRASOUND: In the area of palpable concern, there is a dermal lesion likely
representative of a sebaceous versus epidermal inclusion cyst with overall oval-shaped and parallel
orientation. No suspicious features. Imaging finding measures up to 1 cm.

IMPRESSION
1. BI-RADS 2, BENIGN.
2. Clinical/surgical management for sebaceous versus epidermal inclusion cyst.

Tech Notes:

## 2022-11-29 ENCOUNTER — Encounter
Admit: 2022-11-29 | Discharge: 2022-11-29 | Payer: MEDICAID | Primary: Student in an Organized Health Care Education/Training Program

## 2022-12-05 ENCOUNTER — Encounter
Admit: 2022-12-05 | Discharge: 2022-12-05 | Payer: MEDICAID | Primary: Student in an Organized Health Care Education/Training Program

## 2022-12-26 ENCOUNTER — Encounter
Admit: 2022-12-26 | Discharge: 2022-12-26 | Payer: MEDICAID | Primary: Student in an Organized Health Care Education/Training Program

## 2022-12-30 ENCOUNTER — Encounter
Admit: 2022-12-30 | Discharge: 2022-12-30 | Payer: MEDICAID | Primary: Student in an Organized Health Care Education/Training Program

## 2023-01-08 ENCOUNTER — Encounter
Admit: 2023-01-08 | Discharge: 2023-01-08 | Payer: MEDICAID | Primary: Student in an Organized Health Care Education/Training Program

## 2023-01-25 ENCOUNTER — Encounter
Admit: 2023-01-25 | Discharge: 2023-01-25 | Payer: MEDICAID | Primary: Student in an Organized Health Care Education/Training Program

## 2023-04-03 ENCOUNTER — Encounter
Admit: 2023-04-03 | Discharge: 2023-04-03 | Payer: MEDICAID | Primary: Student in an Organized Health Care Education/Training Program

## 2023-04-03 NOTE — Telephone Encounter
Received request from DME for provider to sign form necessary for billing. CMN for portable oxygen concentrator. Form completed and given to provider for signature.    Form will be faxed to [facility].

## 2023-04-25 ENCOUNTER — Encounter
Admit: 2023-04-25 | Discharge: 2023-04-25 | Payer: MEDICAID | Primary: Student in an Organized Health Care Education/Training Program

## 2023-04-30 ENCOUNTER — Encounter
Admit: 2023-04-30 | Discharge: 2023-04-30 | Payer: MEDICAID | Primary: Student in an Organized Health Care Education/Training Program

## 2023-05-01 ENCOUNTER — Encounter
Admit: 2023-05-01 | Discharge: 2023-05-01 | Payer: MEDICAID | Primary: Student in an Organized Health Care Education/Training Program

## 2023-05-01 NOTE — Telephone Encounter
The patient's oxygen f/u office visit notes from 07/26/23 were faxed to their DME, [facility].

## 2023-07-20 ENCOUNTER — Encounter
Admit: 2023-07-20 | Discharge: 2023-07-20 | Payer: MEDICAID | Primary: Student in an Organized Health Care Education/Training Program

## 2023-07-23 NOTE — Patient Instructions
IMPORTANT CLINIC INFORMATION    -- Preferred method of communication is through [employer], if the issue cannot wait until your next scheduled follow up.     -- MyChart may be used for non-emergent communication. Emails are not reviewed after hours or over the weekend/holidays/after 4PM. Staff will reply to your email within 24-48 business hours.     -- If you do not hear from [place] within one week of a lab or imaging study being completed, please call/send my chart email to the office to be sure that we have received the results. This is especially challenging when tests are done outside of the [facility] system, as many times results do not make it back to our office for a variety of reasons. In our office no news is good news does not apply. You should hear from [place] with results for each test.  [facility] lab/imaging results:  Due to the CARES act, results automatically release to MyChart.  Dr. Meredeth Ide will continue to send you a result note on any labs that she orders.  With these changes you may see your results before Dr. Meredeth Ide does.   Please allow up to 72 hours for review and response to your results.     -- If you are having acute (new/sudden onset) or severe/worsening neurologic symptoms, please call 911 or seek care in ED.    -- For scheduling of IMAGING/RADIOLOGY, please call [phone] at your convenience to schedule your studies.  -- For referrals placed during the visit, if you have not heard from scheduling within one week, please call the call center at [phone] to get scheduling assistance.  -- For refills on medications, please first contact your pharmacy, who will fax a refill authorization request form to our office.  Weekdays only. Allow up to 2 business days for refills. Please plan ahead, as refills will not be filled after hours.  -- Our scheduling staff, Marisue Brooklyn may be reached at [phone] for scheduling needs.   -- Jeoffrey Massed, RN, may be contacted at [phone] for urgent needs. Staff will return your call within 24 business hours.     For Appointments:   -- Please try to arrive early for your appointment time to help facilitate your visit. 15 minutes early is recommended.   -- If you are late to your appointment, we reserve the right to ask you to reschedule or wait until next available time to be seen in fairness to other patients scheduled that day.   -- There are times when we are running behind in clinic. Our goal is to always be on time, however, there are time when unexpected events occur with patients, which may cause a delay. We appreciate your understanding when this occurs.

## 2023-07-26 ENCOUNTER — Encounter
Admit: 2023-07-26 | Discharge: 2023-07-26 | Payer: MEDICAID | Primary: Student in an Organized Health Care Education/Training Program

## 2023-07-26 ENCOUNTER — Ambulatory Visit
Admit: 2023-07-26 | Discharge: 2023-07-26 | Payer: MEDICAID | Primary: Student in an Organized Health Care Education/Training Program

## 2023-07-26 DIAGNOSIS — J329 Chronic sinusitis, unspecified: Secondary | ICD-10-CM

## 2023-07-26 DIAGNOSIS — I272 Pulmonary hypertension, unspecified: Secondary | ICD-10-CM

## 2023-07-26 DIAGNOSIS — C801 Malignant (primary) neoplasm, unspecified: Secondary | ICD-10-CM

## 2023-07-26 DIAGNOSIS — I519 Heart disease, unspecified: Secondary | ICD-10-CM

## 2023-07-26 DIAGNOSIS — G43909 Migraine, unspecified, not intractable, without status migrainosus: Secondary | ICD-10-CM

## 2023-07-26 DIAGNOSIS — M549 Dorsalgia, unspecified: Secondary | ICD-10-CM

## 2023-07-26 DIAGNOSIS — T63301A Toxic effect of unspecified spider venom, accidental (unintentional), initial encounter: Secondary | ICD-10-CM

## 2023-07-26 DIAGNOSIS — I10 Essential (primary) hypertension: Secondary | ICD-10-CM

## 2023-07-26 DIAGNOSIS — M797 Fibromyalgia: Secondary | ICD-10-CM

## 2023-07-26 DIAGNOSIS — Z91018 Allergy to other foods: Secondary | ICD-10-CM

## 2023-07-26 DIAGNOSIS — R531 Weakness: Secondary | ICD-10-CM

## 2023-07-26 DIAGNOSIS — M359 Systemic involvement of connective tissue, unspecified: Secondary | ICD-10-CM

## 2023-07-26 DIAGNOSIS — G709 Myoneural disorder, unspecified: Secondary | ICD-10-CM

## 2023-07-26 DIAGNOSIS — I2510 Atherosclerotic heart disease of native coronary artery without angina pectoris: Secondary | ICD-10-CM

## 2023-07-26 DIAGNOSIS — R110 Nausea: Secondary | ICD-10-CM

## 2023-07-26 DIAGNOSIS — F845 Asperger's syndrome: Secondary | ICD-10-CM

## 2023-07-26 DIAGNOSIS — F419 Anxiety disorder, unspecified: Secondary | ICD-10-CM

## 2023-07-26 DIAGNOSIS — J302 Other seasonal allergic rhinitis: Secondary | ICD-10-CM

## 2023-07-26 LAB — COMPREHENSIVE METABOLIC PANEL
ALBUMIN: 4.4 g/dL (ref 3.5–5.0)
ALK PHOSPHATASE: 98 U/L (ref 25–110)
AST: 31 U/L (ref 7–40)
CALCIUM: 9.5 mg/dL (ref 8.5–10.6)
CHLORIDE: 103 MMOL/L — ABNORMAL HIGH (ref 98–110)
CO2: 26 MMOL/L (ref 21–30)
CREATININE: 0.8 mg/dL (ref 0.4–1.00)
GLUCOSE,PANEL: 78 mg/dL — ABNORMAL HIGH (ref 70–100)
SODIUM: 140 MMOL/L — ABNORMAL HIGH (ref 137–147)
TOTAL BILIRUBIN: 0.3 mg/dL (ref 0.2–1.3)
TOTAL PROTEIN: 7.7 g/dL (ref 6.0–8.0)

## 2023-07-26 LAB — CBC
RBC COUNT: 4.7 M/UL (ref 4.0–5.0)
WBC COUNT: 7.4 10*3/uL (ref 4.5–11.0)

## 2023-07-26 LAB — VITAMIN B12: VITAMIN B12: 153 pg/mL — ABNORMAL LOW (ref 180–914)

## 2023-07-26 LAB — CREATINE KINASE-CPK: CK TOTAL: 129 U/L (ref 21–215)

## 2023-07-26 LAB — SED RATE: ESR: 7 mm/h (ref 0–20)

## 2023-08-14 ENCOUNTER — Encounter
Admit: 2023-08-14 | Discharge: 2023-08-14 | Payer: MEDICAID | Primary: Student in an Organized Health Care Education/Training Program

## 2023-08-21 ENCOUNTER — Encounter
Admit: 2023-08-21 | Discharge: 2023-08-21 | Payer: MEDICAID | Primary: Student in an Organized Health Care Education/Training Program

## 2023-10-25 ENCOUNTER — Encounter
Admit: 2023-10-25 | Discharge: 2023-10-25 | Payer: MEDICAID | Primary: Student in an Organized Health Care Education/Training Program

## 2023-10-25 NOTE — Telephone Encounter
Call from pt asking for letter to take oxygen concentrator on the plane. Called back, form sent in [facility] message.

## 2024-01-15 ENCOUNTER — Encounter
Admit: 2024-01-15 | Discharge: 2024-01-15 | Payer: MEDICAID | Primary: Student in an Organized Health Care Education/Training Program

## 2024-01-24 ENCOUNTER — Encounter
Admit: 2024-01-24 | Discharge: 2024-01-24 | Payer: MEDICAID | Primary: Student in an Organized Health Care Education/Training Program

## 2024-03-19 ENCOUNTER — Encounter
Admit: 2024-03-19 | Discharge: 2024-03-19 | Payer: MEDICAID | Primary: Student in an Organized Health Care Education/Training Program

## 2024-03-19 NOTE — Telephone Encounter
[place] into [place] on [place] folder for uploading to patients chart.

## 2024-04-10 ENCOUNTER — Encounter
Admit: 2024-04-10 | Discharge: 2024-04-10 | Payer: MEDICAID | Primary: Student in an Organized Health Care Education/Training Program

## 2024-04-18 ENCOUNTER — Encounter
Admit: 2024-04-18 | Discharge: 2024-04-18 | Payer: MEDICAID | Primary: Student in an Organized Health Care Education/Training Program

## 2024-06-06 ENCOUNTER — Encounter
Admit: 2024-06-06 | Discharge: 2024-06-06 | Payer: MEDICAID | Primary: Student in an Organized Health Care Education/Training Program

## 2024-06-12 ENCOUNTER — Encounter
Admit: 2024-06-12 | Discharge: 2024-06-12 | Payer: MEDICAID | Primary: Student in an Organized Health Care Education/Training Program

## 2024-08-22 ENCOUNTER — Encounter
Admit: 2024-08-22 | Discharge: 2024-08-22 | Payer: MEDICAID | Primary: Student in an Organized Health Care Education/Training Program

## 2024-09-04 ENCOUNTER — Encounter
Admit: 2024-09-04 | Discharge: 2024-09-04 | Payer: MEDICAID | Primary: Student in an Organized Health Care Education/Training Program

## 2024-09-09 ENCOUNTER — Encounter
Admit: 2024-09-09 | Discharge: 2024-09-09 | Payer: MEDICAID | Primary: Student in an Organized Health Care Education/Training Program

## 2024-10-06 ENCOUNTER — Encounter
Admit: 2024-10-06 | Discharge: 2024-10-06 | Payer: MEDICAID | Primary: Student in an Organized Health Care Education/Training Program

## 2024-10-20 ENCOUNTER — Encounter
Admit: 2024-10-20 | Discharge: 2024-10-20 | Payer: MEDICAID | Primary: Student in an Organized Health Care Education/Training Program

## 2024-11-05 ENCOUNTER — Encounter: Admit: 2024-11-05 | Discharge: 2024-11-05 | Payer: MEDICAID

## 2024-11-05 ENCOUNTER — Ambulatory Visit: Admit: 2024-11-05 | Discharge: 2024-11-06 | Payer: MEDICAID

## 2024-11-05 VITALS — BP 143/99 | HR 113 | Ht 66.0 in | Wt 171.8 lb

## 2024-11-05 DIAGNOSIS — G43719 Chronic migraine without aura, intractable, without status migrainosus: Secondary | ICD-10-CM

## 2024-11-05 DIAGNOSIS — F514 Sleep terrors [night terrors]: Secondary | ICD-10-CM

## 2024-11-05 DIAGNOSIS — F413 Other mixed anxiety disorders: Secondary | ICD-10-CM

## 2024-11-05 DIAGNOSIS — G43109 Migraine with aura, not intractable, without status migrainosus: Principal | ICD-10-CM

## 2024-11-05 MED ORDER — PRAZOSIN 2 MG PO CAP
2 mg | ORAL_CAPSULE | Freq: Every evening | ORAL | 1 refills | 30.00000 days | Status: AC
Start: 2024-11-05 — End: ?

## 2024-11-05 MED ORDER — NEBIVOLOL 5 MG PO TAB
5 mg | ORAL_TABLET | Freq: Every day | ORAL | 1 refills | 60.00000 days | Status: AC
Start: 2024-11-05 — End: ?

## 2024-11-05 NOTE — Patient Instructions [37]
 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  Will arrange for repeat echocardiogram to reassess your heart structure and function.  Will work to try to optimize your medication with following changes.  Stop the following:  - Amlodipine, CoQ 10, colchicine, metoprolol , and hydrochlorothiazide    Increase your prazosin  to 2 mg every night  Start Bystolic  at 5 mg daily    Follow-up with your general doctor regarding management of your tramadol , considering taking 3 times a day with a max of 4 mg/day    Will notify you of the results of your echocardiogram  Follow-up with me to be in approximately 2 to 3 months.

## 2024-11-05 NOTE — Telephone Encounter [36]
 Please fax the following information for continuation of care: Bonnie James DOB 03/07/1976  Discharge summary from [facility] around thanksgiving 2025  Medication list  EKG's  Recent Labs  Any other cardiac testing        Patient has an upcoming appointment with Dr. Rosia    FAX number  [phone]    Please send records as soon as possible.     Thank you!

## 2024-11-05 NOTE — Progress Notes [1]
 Date of Service: 11/05/2024    Bonnie James is a 49 y.o. female.       HPI     Bonnie James is a 49 year old female with COPD and hypertension who presents with worsening dizziness, fatigue, and chest pain.    She has been experiencing worsening dizziness, fatigue, and chest pain over the past month. These symptoms intensified following a stressful encounter with her sister, which led to an arrest and subsequent hospitalization for a mitochondrial event, described as a small heart attack. During her hospital stay, her ejection fraction was noted to be 35%.    She experiences significant dizziness and weakness, which have made it difficult for her to attend medical appointments. Her heart rate increases significantly upon standing, and she experiences uncontrolled blood pressure. She was previously on amlodipine, which effectively controlled her blood pressure, but it was discontinued during her recent hospitalization. She is currently on metoprolol  and spironolactone , but not Jardiance due to insurance issues.    She reports fatigue, weakness, and her extremities turning blue, along with increased sensitivity to temperature changes and excessive sweating since her hospital discharge. She also mentions having polycythemia and requiring phlebotomies due to hypoxia and thick blood.    Her social history includes smoking about half a pack per day, and she is currently dealing with significant stress related to family dynamics, particularly with her sister. 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    She is on multiple medications, including tramadol  for pain, which she takes twice in the morning, and prazosin  for night terrors, which has been increased during her hospital stay. She also takes Vyvanse for ADHD and occasionally uses Ativan for anxiety.       Objective   Vitals:    11/05/24 1334 11/05/24 1349 11/05/24 1350   BP: (!) 151/107 (!) 138/99 (!) 143/99   BP Source: Arm, Right Upper Arm, Right Upper Arm, Right Upper   Pulse: 106 99 113   SpO2: 97%     O2 Device: Nasal cannula     O2 Liter Flow: 2 Lpm     PainSc: Seven     Weight: 77.9 kg (171 lb 12.8 oz)     Height: 167.6 cm (5' 6)       Body mass index is 27.73 kg/m?SABRA     Past Medical History  Patient Active Problem List    Diagnosis Date Noted    Nocturnal hypoxemia 07/25/2022     Using oxygen at 2L at night with sleeping, really happy with this      Shortness of breath 01/31/2022     HST: 01/01/2022 AHI: 0.9, AHI 0.9, Mean 91%, lowest 84% SpO2 for 7 mins      Cellulitis of left lower extremity 02/02/2020    Edema of left lower extremity 01/30/2020    Closed displaced fracture of fifth metatarsal bone of left foot 09/10/2019    Suspected COVID-19 virus infection 01/30/2019    Chest pain 10/07/2018    Palpitations 10/07/2018     11-07-2018 Holter Monitor   A 48-hour Holter monitor demonstrates sinus at 68-165 bpm with a mean of 106 bpm. The average rate is somewhat fast. There are 1295 isolated APCs which is 0.4% of all beats. There are 36 atrial couplets. There are 18 runs of atrial tachycardia for a total of 72 beats (average 4 beats per run). The longest run is 5 beats. There is no A.  fib or a flutter. There is a single isolated PVC with no couplets and no runs. AV conduction is normal. There are no pauses. There are no diary entries or patient triggers      Systolic dysfunction without heart failure 10/07/2018     09/26/2018 ECHO D:   Normal LV size.  Borderline systolic function with EF about 45 to 50%  Unable to assess diastolic function  Normal right ventricular size and systolic function  Normal left atrial size  No significant functional valvular abnormalities  Normal central venous pressure  Unable to assess pulmonary artery systolic pressure due to inadequate TR jet  Only the very beginning of the ascending aortic root was seen up to the sinotubular junction and is normal size.  Further distally the ascending aorta was not seen in the study and cannot be commented on  No pericardial effusion     09-01-2019 ECHO D:   Left Ventricle: The left ventricular size is normal. Concentric remodeling. Poor LV endocardial border visualization, systolic function appears to be mildly to moderately reduced, visually EF 40-45%.Mild global hypokinesis slightly more prominent in the septum. No major changes compared to prior study dated 09/26/2018. Grade I (mild) left ventricular diastolic dysfunction.  Right Ventricle: The right ventricular size is normal. The right ventricular systolic function is normal.  No hemodynamically significant valvular abnormalities.  Normal central venous pressure (0-5 mm Hg).      Migraine with aura and without status migrainosus, not intractable 09/24/2017    Intractable chronic migraine without aura and without status migrainosus 09/24/2017    Essential hypertension 09/24/2017     Regadenoson MPI Stress Test - 09/26/18 at [facility] - This myocardial post imaging study is abnormal due to decreased left ventricular systolic function and also possible EKG changes.  The tomographic pattern was negative for ischemia, there are no fixed or reversible perfusion abnormality.  Significant anteroseptal wall reversible distribution was present due to severe breast artifact.  True ischemia did not appear to be present on the study.  The tomographic images also revealed significant diaphragmatic and bowel artifact.  Mildly depressed left ventricular systolic function, ejection fraction 44%, global hypokinesis. In regards to patient's resting and stress EKG, although difficult to determine on the EKG is available for review, the QTc was borderline elevated at rest (491 ms), and a heart rate of 82 bpm, with exercise it was approximately 537 ms at a heart rate of 103 bpm.  Patient should undergo further evaluation of these findings      Spondylosis of cervical region without myelopathy or radiculopathy 09/24/2017 Review of Systems   Constitutional: Positive for malaise/fatigue.   HENT: Negative.     Eyes:  Positive for blurred vision.   Cardiovascular:  Positive for chest pain and palpitations.   Respiratory:  Positive for shortness of breath.    Endocrine: Negative.    Hematologic/Lymphatic: Negative.    Skin: Negative.    Musculoskeletal:  Positive for muscle weakness and neck pain.   Gastrointestinal:  Positive for nausea.   Genitourinary: Negative.    Neurological:  Positive for dizziness.   Psychiatric/Behavioral: Negative.     Allergic/Immunologic: Negative.        Physical Exam  Awake alert, somewhat anxious but pleasant and cooperative  Pupils are equal reactive without scleral injection  Neck is supple normal carotid upstroke no bruit, mass, jugular venous abnormality  Chest is symmetric, has a faint late inspiratory wheezes fairly diffuse but no focal crackles or wheezes  Heart  S1, S2 is normal without murmur, click, gallop  Abdomen soft and nontender positive masses  Pulses are 2+ and regular rate as well as pedal [place]  Does have dusky appearance of her feet lower legs but symmetric muscle tone, skin turgor, and temperature    Cardiovascular Studies  Orthostatic vitals performed during the clinic assessment did not reveal significant abnormality.  Cardiovascular Health Factors  Vitals BP Readings from Last 3 Encounters:   11/05/24 (!) 143/99   06/12/24 118/64   07/26/23 (!) 144/100     Wt Readings from Last 3 Encounters:   11/05/24 77.9 kg (171 lb 12.8 oz)   06/12/24 74 kg (163 lb 3.2 oz)   07/26/23 81.2 kg (179 lb)     BMI Readings from Last 3 Encounters:   11/05/24 27.73 kg/m?   06/12/24 25.92 kg/m?   07/26/23 28.89 kg/m?      Smoking Tobacco Use History[1]   Lipid Profile Cholesterol   Date Value Ref Range Status   01/06/2021 222 (H) <200 Final     HDL   Date Value Ref Range Status   01/06/2021 43  Final     LDL   Date Value Ref Range Status   01/06/2021 128 (H) <100 Final     Triglycerides   Date Value Ref Range Status   01/06/2021 257 (H) <150 Final      Blood Sugar Hemoglobin A1C   Date Value Ref Range Status   01/30/2020 5.4 4.0 - 6.0 % Final     Comment:     The ADA recommends that most patients with type 1 and type 2 diabetes maintain   an A1c level <7%.       Glucose   Date Value Ref Range Status   06/12/2024 84 70 - 100 mg/dL Final   89/96/7975 78 70 - 100 MG/DL Final   95/89/7976 93  Final         Problems Addressed Today  No diagnosis found.    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 Symptoms include fatigue, weakness, peripheral cyanosis. Possible stress-induced cardiomyopathy considered.  - Ordered repeat echocardiogram to assess current heart function.  - Scheduled echocardiogram at a convenient location for her.  At this time we will try to optimize her medication for her sleep disorder, PTSD and night terrors by increasing her prazosin  to 2 mg nightly.  Stop the metoprolol  and start her on Bystolic  at 5 mg daily with ongoing spironolactone  therapy.  Increasing of the prazosin  with blood pressure monitoring stop metoprolol  XL start Bystolic  5 mg daily    Hypertension  Blood pressure remains uncontrolled on metoprolol  and spironolactone  after discontinuation of amlodipine.  - Stop the metoprolol  and start her on Bystolic  at 5 mg daily with ongoing spironolactone  therapy.  Increasing of the prazosin  with blood pressure monitoring stop metoprolol  XL start Bystolic  5 mg daily  - Monitor blood pressure regularly.    Secondary polycythemia  Polycythemia with hypoxia and thickened blood. No primary cause identified.    Obesity    Chronic obstructive pulmonary disease  COPD with occasional wheezing. Recent sinus infection noted.    Anxiety disorder  Anxiety exacerbated by family stressors. Managed with therapy and medication. Prazosin  for night terrors, Ativan used sparingly.  - Continue current therapy and medication regimen.  - Consider increasing prazosin  for better management of night terrors.    Chronic pain syndrome  Chronic  joint pain managed with tramadol . Concerns about opioid use, but tramadol  is considered non-addictive. Limited options due to gastrointestinal issues and inability to use NSAIDs.  - Advised increase tramadol  to 400 mg per day, divided into three doses.  - Coordinate with primary care for pain management adjustments.         Current Medications (including today's revisions)   acetaminophen  (TYLENOL ) 325 mg tablet Take one tablet by mouth every 4 hours as needed for Pain.    albuterol  0.5% (PROVENTIL ; VENTOLIN ) 2.5 mg/0.5 mL nebulizer solution Inhale one each solution by nebulizer as directed every 6 hours as needed for Shortness of Breath or Wheezing.    albuterol  sulfate (PROAIR  HFA) 90 mcg/actuation aerosol inhaler Inhale one puff by mouth into the lungs every 4 hours. Shake well before use.    ALPRAZolam (XANAX) 0.5 mg tablet Take one tablet by mouth as Needed for Anxiety.    amLODIPine (NORVASC) 5 mg tablet Take one tablet by mouth daily. (Patient taking differently: Take two tablets by mouth daily.)    baclofen (LIORESAL) 20 mg tablet Take one tablet by mouth at bedtime daily. (Patient not taking: Reported on 11/05/2024)    cephalexin (KEFLEX) 500 mg capsule Take one capsule by mouth three times daily. (Patient not taking: Reported on 11/05/2024)    cetirizine  (ZYRTEC ) 10 mg tablet Take one tablet by mouth daily.    coenzyme Q10 (CO Q-10) 100 mg capsule Take one capsule by mouth daily.    colchicine (COLCRYS) 0.6 mg tablet Take one tablet by mouth daily.    cyanocobalamin (vitamin B-12) (RUBRAMIN PC) 1,000 mcg/mL injection solution Inject 1 mL into the muscle every 7 days.    dextroamphetamine-amphetamine (ADDERALL) 5 mg tablet Take one tablet by mouth daily.    diphenhydrAMINE  (BENADRYL ) 25 mg capsule Take one capsule by mouth every 6 hours.    EPINEPHrine (EPIPEN) 1 mg/mL injection pen (2-Pack) Inject 0.3 mL into the muscle once as needed. Inject 0.3 mg (1 Pen) into thigh if needed for anaphylactic reaction. May repeat in 5-15 minutes if needed.    famotidine  (PEPCID ) 20 mg tablet Take one tablet by mouth twice daily.    flaxseed oil (OMEGA 3 PO) Take  by mouth. (Patient not taking: Reported on 11/05/2024)    fluticasone  (FLONASE ) 50 mcg/actuation nasal spray Apply  to each nostril as directed daily. Shake bottle gently before using.    fluticasone /umeclidin/vilanter (TRELEGY ELLIPTA IN) Inhale  by mouth into the lungs.    hydroCHLOROthiazide 12.5 mg tablet Take one tablet by mouth daily.    hydrOXYzine  pamoate (VISTARIL ) 25 mg capsule Every 4-6 Hours As Needed as needed for For Allergies    lisdexamfetamine (VYVANSE) 60 mg capsule Take one capsule by mouth every 24 hours.    meclizine (ANTIVERT) 25 mg tablet Take one tablet by mouth three times daily as needed.    metoprolol  succinate XL (TOPROL  XL) 25 mg extended release tablet Take one tablet by mouth daily.    MULTIVITAMIN PO Take  by mouth. (Patient not taking: Reported on 11/05/2024)    mv-min/iron/folic/calcium/vitK [facility] MULTIVITAMIN PO) Take 1 capsule by mouth daily. (Patient not taking: Reported on 11/05/2024)    ondansetron  HCL (ZOFRAN ) 4 mg tablet Take one tablet by mouth every 8 hours as needed.    pantoprazole DR (PROTONIX) 40 mg tablet Take one tablet by mouth daily. (Patient not taking: Reported on 11/05/2024)    prazosin  (MINIPRESS ) 1 mg capsule Take one capsule by mouth at bedtime daily.  rizatriptan  (MAXALT ) 10 mg tablet Take one tablet by mouth daily as needed. Take one tablet by mouth at onset of headache. May repeat after 2 hours. Max of 30 mg in 24 hours. Do not take while taking Linezolid .    scopolamine  (TRANSDERM-SCOP) 1mg  over 3 days 3 day patch apply on patch topically AS DIRECTED EVERY 72 HOURS    sertraline  (ZOLOFT ) 100 mg tablet Take one tablet by mouth daily.    spironolactone  (ALDACTONE ) 25 mg tablet Take one tablet by mouth daily.    traMADoL  (ULTRAM ) 50 mg tablet Take one tablet by mouth every 6 hours as needed for Pain.    [employer] SAFETY SYRINGE 3 mL 25 gauge x 1 syrg USE 1 TIME MONTHLY FOR B12 INJECTION                 [1]   Social History  Tobacco Use   Smoking Status Every Day    Current packs/day: 0.50    Average packs/day: 0.5 packs/day for 10.0 years (5.0 ttl pk-yrs)    Types: Cigarettes    Passive exposure: Past   Smokeless Tobacco Current   Tobacco Comments    Working on quitting.

## 2024-11-06 DIAGNOSIS — I5189 Other ill-defined heart diseases: Secondary | ICD-10-CM

## 2024-11-06 DIAGNOSIS — R002 Palpitations: Secondary | ICD-10-CM

## 2024-11-12 ENCOUNTER — Encounter: Admit: 2024-11-12 | Discharge: 2024-11-12 | Payer: MEDICAID

## 2024-11-13 ENCOUNTER — Encounter: Admit: 2024-11-13 | Discharge: 2024-11-13 | Payer: MEDICAID

## 2024-11-13 NOTE — Telephone Encounter [36]
 11/13/2024 9:36 AM     Called pt to see if she wanted to come to [facility] today as she was already seen by [facility] 1/14. LVM

## 2024-11-27 ENCOUNTER — Encounter: Admit: 2024-11-27 | Discharge: 2024-11-27 | Payer: MEDICAID
